# Patient Record
Sex: Female | Born: 1990 | Race: Black or African American | Hispanic: No | Marital: Married | Smoking: Former smoker
Health system: Southern US, Community
[De-identification: ages and names within clinical notes are randomized; demographics above are authoritative.]

## PROBLEM LIST (undated history)

## (undated) ENCOUNTER — Inpatient Hospital Stay (HOSPITAL_COMMUNITY): Payer: Self-pay

## (undated) DIAGNOSIS — D499 Neoplasm of unspecified behavior of unspecified site: Secondary | ICD-10-CM

## (undated) HISTORY — DX: Neoplasm of unspecified behavior of unspecified site: D49.9

---
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
## 2010-10-14 ENCOUNTER — Emergency Department (HOSPITAL_COMMUNITY)
Admission: EM | Admit: 2010-10-14 | Discharge: 2010-10-15 | Payer: Self-pay | Source: Home / Self Care | Admitting: Emergency Medicine

## 2011-01-10 LAB — DIFFERENTIAL
Basophils Absolute: 0 10*3/uL (ref 0.0–0.1)
Basophils Relative: 0 % (ref 0–1)
Eosinophils Absolute: 0.1 10*3/uL (ref 0.0–0.7)
Eosinophils Relative: 1 % (ref 0–5)
Lymphocytes Relative: 20 % (ref 12–46)
Lymphs Abs: 2.9 10*3/uL (ref 0.7–4.0)
Monocytes Absolute: 1.1 10*3/uL — ABNORMAL HIGH (ref 0.1–1.0)
Monocytes Relative: 7 % (ref 3–12)
Neutro Abs: 10.7 10*3/uL — ABNORMAL HIGH (ref 1.7–7.7)
Neutrophils Relative %: 72 % (ref 43–77)

## 2011-01-10 LAB — CBC
HCT: 35.1 % — ABNORMAL LOW (ref 36.0–46.0)
Hemoglobin: 10.8 g/dL — ABNORMAL LOW (ref 12.0–15.0)
MCH: 22.9 pg — ABNORMAL LOW (ref 26.0–34.0)
MCHC: 30.8 g/dL (ref 30.0–36.0)
MCV: 74.4 fL — ABNORMAL LOW (ref 78.0–100.0)
Platelets: 446 10*3/uL — ABNORMAL HIGH (ref 150–400)
RBC: 4.72 MIL/uL (ref 3.87–5.11)
RDW: 16.4 % — ABNORMAL HIGH (ref 11.5–15.5)
WBC: 14.8 10*3/uL — ABNORMAL HIGH (ref 4.0–10.5)

## 2011-01-10 LAB — URINE CULTURE
Colony Count: 30000
Culture  Setup Time: 201112160845

## 2011-01-10 LAB — BASIC METABOLIC PANEL
BUN: 5 mg/dL — ABNORMAL LOW (ref 6–23)
CO2: 27 mEq/L (ref 19–32)
Calcium: 8.9 mg/dL (ref 8.4–10.5)
Chloride: 106 mEq/L (ref 96–112)
Creatinine, Ser: 0.83 mg/dL (ref 0.4–1.2)
GFR calc Af Amer: >60 mL/min (ref >60)
GFR calc non Af Amer: >60 mL/min (ref >60)
Glucose, Bld: 87 mg/dL (ref 70–99)
Potassium: 3.4 mEq/L — ABNORMAL LOW (ref 3.5–5.1)
Sodium: 138 mEq/L (ref 135–145)

## 2011-01-10 LAB — URINALYSIS, ROUTINE W REFLEX MICROSCOPIC
Bilirubin Urine: NEGATIVE
Glucose, UA: NEGATIVE mg/dL
Ketones, ur: NEGATIVE mg/dL
Nitrite: NEGATIVE
Protein, ur: NEGATIVE mg/dL
Specific Gravity, Urine: 1.028 (ref 1.005–1.030)
Urobilinogen, UA: 2 mg/dL — ABNORMAL HIGH (ref 0.0–1.0)
pH: 7 (ref 5.0–8.0)

## 2011-01-10 LAB — WET PREP, GENITAL
Trich, Wet Prep: NONE SEEN
Yeast Wet Prep HPF POC: NONE SEEN

## 2011-01-10 LAB — URINE MICROSCOPIC-ADD ON

## 2011-01-10 LAB — PREGNANCY, URINE: Preg Test, Ur: NEGATIVE

## 2011-01-10 LAB — GC/CHLAMYDIA PROBE AMP, GENITAL
Chlamydia, DNA Probe: NEGATIVE
GC Probe Amp, Genital: POSITIVE — AB

## 2011-06-29 ENCOUNTER — Emergency Department (HOSPITAL_COMMUNITY)
Admission: EM | Admit: 2011-06-29 | Discharge: 2011-06-30 | Disposition: A | Discharge status: Home / Self Care | Payer: Medicaid Other | Attending: Emergency Medicine | Admitting: Emergency Medicine

## 2011-06-29 DIAGNOSIS — R42 Dizziness and giddiness: Secondary | ICD-10-CM | POA: Insufficient documentation

## 2011-06-29 DIAGNOSIS — O9989 Other specified diseases and conditions complicating pregnancy, childbirth and the puerperium: Secondary | ICD-10-CM | POA: Insufficient documentation

## 2011-06-29 DIAGNOSIS — R109 Unspecified abdominal pain: Secondary | ICD-10-CM | POA: Insufficient documentation

## 2011-06-29 LAB — POCT PREGNANCY, URINE: Preg Test, Ur: POSITIVE

## 2011-06-30 ENCOUNTER — Emergency Department (HOSPITAL_COMMUNITY): Payer: Medicaid Other

## 2011-06-30 LAB — DIFFERENTIAL
Basophils Absolute: 0.1 10*3/uL (ref 0.0–0.1)
Basophils Relative: 0 % (ref 0–1)
Eosinophils Absolute: 0.4 10*3/uL (ref 0.0–0.7)
Eosinophils Relative: 3 % (ref 0–5)
Lymphocytes Relative: 28 % (ref 12–46)
Lymphs Abs: 4.6 10*3/uL — ABNORMAL HIGH (ref 0.7–4.0)
Monocytes Absolute: 0.9 10*3/uL (ref 0.1–1.0)
Monocytes Relative: 6 % (ref 3–12)
Neutro Abs: 10.4 10*3/uL — ABNORMAL HIGH (ref 1.7–7.7)
Neutrophils Relative %: 64 % (ref 43–77)

## 2011-06-30 LAB — URINALYSIS, ROUTINE W REFLEX MICROSCOPIC
Bilirubin Urine: NEGATIVE
Glucose, UA: NEGATIVE mg/dL
Hgb urine dipstick: NEGATIVE
Ketones, ur: NEGATIVE mg/dL
Nitrite: NEGATIVE
Protein, ur: NEGATIVE mg/dL
Specific Gravity, Urine: 1.025 (ref 1.005–1.030)
Urobilinogen, UA: 1 mg/dL (ref 0.0–1.0)
pH: 6.5 (ref 5.0–8.0)

## 2011-06-30 LAB — CBC
HCT: 32.2 % — ABNORMAL LOW (ref 36.0–46.0)
Hemoglobin: 10 g/dL — ABNORMAL LOW (ref 12.0–15.0)
MCH: 22.6 pg — ABNORMAL LOW (ref 26.0–34.0)
MCHC: 31.1 g/dL (ref 30.0–36.0)
MCV: 72.9 fL — ABNORMAL LOW (ref 78.0–100.0)
Platelets: 383 10*3/uL (ref 150–400)
RBC: 4.42 MIL/uL (ref 3.87–5.11)
RDW: 16.5 % — ABNORMAL HIGH (ref 11.5–15.5)
WBC: 16.3 10*3/uL — ABNORMAL HIGH (ref 4.0–10.5)

## 2011-06-30 LAB — URINE MICROSCOPIC-ADD ON

## 2011-06-30 LAB — HCG, QUANTITATIVE, PREGNANCY: hCG, Beta Chain, Quant, S: 17108 m[IU]/mL — ABNORMAL HIGH (ref <5)

## 2011-08-20 ENCOUNTER — Emergency Department (HOSPITAL_COMMUNITY)
Admission: EM | Admit: 2011-08-20 | Discharge: 2011-08-20 | Disposition: A | Discharge status: Home / Self Care | Payer: Medicaid Other | Attending: Emergency Medicine | Admitting: Emergency Medicine

## 2011-08-20 ENCOUNTER — Emergency Department (HOSPITAL_COMMUNITY): Payer: Medicaid Other

## 2011-08-20 DIAGNOSIS — R109 Unspecified abdominal pain: Secondary | ICD-10-CM | POA: Insufficient documentation

## 2011-08-20 DIAGNOSIS — N949 Unspecified condition associated with female genital organs and menstrual cycle: Secondary | ICD-10-CM | POA: Insufficient documentation

## 2011-08-20 DIAGNOSIS — O99891 Other specified diseases and conditions complicating pregnancy: Secondary | ICD-10-CM | POA: Insufficient documentation

## 2011-08-20 DIAGNOSIS — R10819 Abdominal tenderness, unspecified site: Secondary | ICD-10-CM | POA: Insufficient documentation

## 2011-08-20 LAB — URINE MICROSCOPIC-ADD ON

## 2011-08-20 LAB — URINALYSIS, ROUTINE W REFLEX MICROSCOPIC
Bilirubin Urine: NEGATIVE
Glucose, UA: NEGATIVE mg/dL
Hgb urine dipstick: NEGATIVE
Ketones, ur: 15 mg/dL — AB
Nitrite: NEGATIVE
Protein, ur: NEGATIVE mg/dL
Specific Gravity, Urine: 1.018 (ref 1.005–1.030)
Urobilinogen, UA: 0.2 mg/dL (ref 0.0–1.0)
pH: 7 (ref 5.0–8.0)

## 2011-08-20 LAB — WET PREP, GENITAL
Clue Cells Wet Prep HPF POC: NONE SEEN
Trich, Wet Prep: NONE SEEN
Yeast Wet Prep HPF POC: NONE SEEN

## 2011-08-22 LAB — GC/CHLAMYDIA PROBE AMP, GENITAL
Chlamydia, DNA Probe: NEGATIVE
GC Probe Amp, Genital: NEGATIVE

## 2011-08-22 LAB — URINE CULTURE

## 2011-09-16 LAB — OB RESULTS CONSOLE ANTIBODY SCREEN: Antibody Screen: NEGATIVE

## 2011-09-16 LAB — OB RESULTS CONSOLE HEPATITIS B SURFACE ANTIGEN: Hepatitis B Surface Ag: NEGATIVE

## 2011-09-16 LAB — OB RESULTS CONSOLE ABO/RH: RH Type: POSITIVE

## 2011-09-16 LAB — OB RESULTS CONSOLE GC/CHLAMYDIA: Gonorrhea: NEGATIVE

## 2011-09-20 ENCOUNTER — Inpatient Hospital Stay (HOSPITAL_COMMUNITY)
Admission: AD | Admit: 2011-09-20 | Discharge: 2011-09-20 | Disposition: A | Discharge status: Home / Self Care | Payer: Medicaid Other | Source: Ambulatory Visit | Attending: Obstetrics | Admitting: Obstetrics

## 2011-09-20 ENCOUNTER — Encounter (HOSPITAL_COMMUNITY): Payer: Self-pay

## 2011-09-20 DIAGNOSIS — T148XXA Other injury of unspecified body region, initial encounter: Secondary | ICD-10-CM

## 2011-09-20 DIAGNOSIS — M542 Cervicalgia: Secondary | ICD-10-CM | POA: Insufficient documentation

## 2011-09-20 DIAGNOSIS — M25519 Pain in unspecified shoulder: Secondary | ICD-10-CM

## 2011-09-20 DIAGNOSIS — O99891 Other specified diseases and conditions complicating pregnancy: Secondary | ICD-10-CM | POA: Insufficient documentation

## 2011-09-20 DIAGNOSIS — M62838 Other muscle spasm: Secondary | ICD-10-CM | POA: Insufficient documentation

## 2011-09-20 MED ORDER — CYCLOBENZAPRINE HCL 10 MG PO TABS
10.0000 mg | ORAL_TABLET | Freq: Once | ORAL | Status: AC
  Administered 2011-09-20: 10 mg via ORAL
  Filled 2011-09-20: qty 1

## 2011-09-20 MED ORDER — CYCLOBENZAPRINE HCL 10 MG PO TABS: 10.0000 mg | ORAL_TABLET | Freq: Three times a day (TID) | ORAL | Status: DC | PRN

## 2011-09-20 NOTE — Progress Notes (Signed)
Patient states she has been having pain on the left side of her neck radiating into left shoulder for about 3 days, Hurtst more with movement.

## 2011-09-20 NOTE — ED Provider Notes (Signed)
Chief Complaint:  Neck Pain   Paula Sandoval is  20 y.o. G1P0.  No LMP recorded. Patient is pregnant..  Her pregnancy status is positive.[redacted]w[redacted]d   She presents complaining of Neck Pain . Onset is described as intermittent and has been present for  3 days. Pain in left neck and shoulder. Denies shoulder injury. No pregnancy complaints.   Obstetrical/Gynecological History: OB History    Grav Para Term Preterm Abortions TAB SAB Ect Mult Living   1               Past Medical History: Past Medical History  Diagnosis Date  . Asthma     Past Surgical History: Past Surgical History  Procedure Date  . Excision of tumor right leg 2009    Family History: Family History  Problem Relation Age of Onset  . Anesthesia problems Neg Hx   . Hypotension Neg Hx   . Malignant hyperthermia Neg Hx   . Pseudochol deficiency Neg Hx     Social History: History  Substance Use Topics  . Smoking status: Never Smoker   . Smokeless tobacco: Never Used  . Alcohol Use: No    Allergies: No Known Allergies  Prescriptions prior to admission  Medication Sig Dispense Refill  . prenatal vitamin w/FE, FA (PRENATAL 1 + 1) 27-1 MG TABS Take 1 tablet by mouth daily.          Review of Systems - Negative except what is noted in HPI  Physical Exam   Blood pressure 120/70, pulse 108, temperature 99.4 F (37.4 C), temperature source Oral, resp. rate 20, height 5' 9.5" (1.765 m), weight 134.718 kg (297 lb), SpO2 98.00%.  General: General appearance - alert, well appearing, and in no distress, oriented to person, place, and time and overweight Mental status - alert, oriented to person, place, and time, normal mood, behavior, speech, dress, motor activity, and thought processes, affect appropriate to mood Neck - supple, no significant adenopathy, full ROM Lymphatics - no palpable lymphadenopathy, no hepatosplenomegaly Abdomen - soft, nontender, nondistended, no masses or organomegaly obese Neurological -  alert, oriented, normal speech, no focal findings or movement disorder noted, screening mental status exam normal, neck supple without rigidity, cranial nerves II through XII intact, equal strength of upper extremities BL Musculoskeletal - abnormal exam of left shoulder, abnormal active range of motion of shoulder, + soft tissue tenderness in left shoulder, no evidence of dislocation Extremities - peripheral pulses normal, no pedal edema, no clubbing or cyanosis Focused Gynecological Exam: examination not indicated, FHR: 154 dopplered  ED Course: Muscle relaxer given  Assessment: Muscle strain/spasm of left shoulder  Plan: Discharge home  Rx flexeril Alternate heat and ice, may use non-aspirin sports cream prn  Deavin Forst E. 09/20/2011,4:22 PM

## 2012-01-26 LAB — OB RESULTS CONSOLE GBS: GBS: NEGATIVE

## 2012-02-14 ENCOUNTER — Encounter (HOSPITAL_COMMUNITY): Payer: Self-pay

## 2012-02-14 ENCOUNTER — Inpatient Hospital Stay (HOSPITAL_COMMUNITY)
Admission: AD | Admit: 2012-02-14 | Discharge: 2012-02-14 | Disposition: A | Discharge status: Home / Self Care | Payer: Medicaid Other | Source: Ambulatory Visit | Attending: Obstetrics | Admitting: Obstetrics

## 2012-02-14 DIAGNOSIS — O479 False labor, unspecified: Secondary | ICD-10-CM | POA: Insufficient documentation

## 2012-02-14 NOTE — MAU Note (Signed)
Per pt ctx's q3 minutes, began getting consistent at 0030, denies bleeding or lof.

## 2012-02-14 NOTE — Discharge Instructions (Signed)
Fetal Movement Counts Patient Name: __________________________________________________ Patient Due Date: ____________________ Kick counts is highly recommended in high risk pregnancies, but it is a good idea for every pregnant woman to do. Start counting fetal movements at 28 weeks of the pregnancy. Fetal movements increase after eating a full meal or eating or drinking something sweet (the blood sugar is higher). It is also important to drink plenty of fluids (well hydrated) before doing the count. Lie on your left side because it helps with the circulation or you can sit in a comfortable chair with your arms over your belly (abdomen) with no distractions around you. DOING THE COUNT  Try to do the count the same time of day each time you do it.   Mark the day and time, then see how long it takes for you to feel 10 movements (kicks, flutters, swishes, rolls). You should have at least 10 movements within 2 hours. You will most likely feel 10 movements in much less than 2 hours. If you do not, wait an hour and count again. After a couple of days you will see a pattern.   What you are looking for is a change in the pattern or not enough counts in 2 hours. Is it taking longer in time to reach 10 movements?  SEEK MEDICAL CARE IF:  You feel less than 10 counts in 2 hours. Tried twice.   No movement in one hour.   The pattern is changing or taking longer each day to reach 10 counts in 2 hours.   You feel the baby is not moving as it usually does.  Date: ____________ Movements: ____________ Start time: ____________ Finish time: ____________  Date: ____________ Movements: ____________ Start time: ____________ Finish time: ____________ Date: ____________ Movements: ____________ Start time: ____________ Finish time: ____________ Date: ____________ Movements: ____________ Start time: ____________ Finish time: ____________ Date: ____________ Movements: ____________ Start time: ____________ Finish time:  ____________ Date: ____________ Movements: ____________ Start time: ____________ Finish time: ____________ Date: ____________ Movements: ____________ Start time: ____________ Finish time: ____________ Date: ____________ Movements: ____________ Start time: ____________ Finish time: ____________  Date: ____________ Movements: ____________ Start time: ____________ Finish time: ____________ Date: ____________ Movements: ____________ Start time: ____________ Finish time: ____________ Date: ____________ Movements: ____________ Start time: ____________ Finish time: ____________ Date: ____________ Movements: ____________ Start time: ____________ Finish time: ____________ Date: ____________ Movements: ____________ Start time: ____________ Finish time: ____________ Date: ____________ Movements: ____________ Start time: ____________ Finish time: ____________ Date: ____________ Movements: ____________ Start time: ____________ Finish time: ____________  Date: ____________ Movements: ____________ Start time: ____________ Finish time: ____________ Date: ____________ Movements: ____________ Start time: ____________ Finish time: ____________ Date: ____________ Movements: ____________ Start time: ____________ Finish time: ____________ Date: ____________ Movements: ____________ Start time: ____________ Finish time: ____________ Date: ____________ Movements: ____________ Start time: ____________ Finish time: ____________ Date: ____________ Movements: ____________ Start time: ____________ Finish time: ____________ Date: ____________ Movements: ____________ Start time: ____________ Finish time: ____________  Date: ____________ Movements: ____________ Start time: ____________ Finish time: ____________ Date: ____________ Movements: ____________ Start time: ____________ Finish time: ____________ Date: ____________ Movements: ____________ Start time: ____________ Finish time: ____________ Date: ____________ Movements:  ____________ Start time: ____________ Finish time: ____________ Date: ____________ Movements: ____________ Start time: ____________ Finish time: ____________ Date: ____________ Movements: ____________ Start time: ____________ Finish time: ____________ Date: ____________ Movements: ____________ Start time: ____________ Finish time: ____________  Date: ____________ Movements: ____________ Start time: ____________ Finish time: ____________ Date: ____________ Movements: ____________ Start time: ____________ Finish time: ____________ Date: ____________ Movements: ____________ Start time:   ____________ Finish time: ____________ Date: ____________ Movements: ____________ Start time: ____________ Finish time: ____________ Date: ____________ Movements: ____________ Start time: ____________ Finish time: ____________ Date: ____________ Movements: ____________ Start time: ____________ Finish time: ____________ Date: ____________ Movements: ____________ Start time: ____________ Finish time: ____________  Date: ____________ Movements: ____________ Start time: ____________ Finish time: ____________ Date: ____________ Movements: ____________ Start time: ____________ Finish time: ____________ Date: ____________ Movements: ____________ Start time: ____________ Finish time: ____________ Date: ____________ Movements: ____________ Start time: ____________ Finish time: ____________ Date: ____________ Movements: ____________ Start time: ____________ Finish time: ____________ Date: ____________ Movements: ____________ Start time: ____________ Finish time: ____________ Date: ____________ Movements: ____________ Start time: ____________ Finish time: ____________  Date: ____________ Movements: ____________ Start time: ____________ Finish time: ____________ Date: ____________ Movements: ____________ Start time: ____________ Finish time: ____________ Date: ____________ Movements: ____________ Start time: ____________ Finish  time: ____________ Date: ____________ Movements: ____________ Start time: ____________ Finish time: ____________ Date: ____________ Movements: ____________ Start time: ____________ Finish time: ____________ Date: ____________ Movements: ____________ Start time: ____________ Finish time: ____________ Date: ____________ Movements: ____________ Start time: ____________ Finish time: ____________  Date: ____________ Movements: ____________ Start time: ____________ Finish time: ____________ Date: ____________ Movements: ____________ Start time: ____________ Finish time: ____________ Date: ____________ Movements: ____________ Start time: ____________ Finish time: ____________ Date: ____________ Movements: ____________ Start time: ____________ Finish time: ____________ Date: ____________ Movements: ____________ Start time: ____________ Finish time: ____________ Date: ____________ Movements: ____________ Start time: ____________ Finish time: ____________ Document Released: 11/16/2006 Document Revised: 10/06/2011 Document Reviewed: 05/19/2009 [employer] Patient Information 2012 [employer].Normal Labor and Delivery Your caregiver must first be sure you are in labor. Signs of labor include:  You may pass what is called "the mucus plug" before labor begins. This is a small amount of blood stained mucus.   Regular uterine contractions.   The time between contractions get closer together.   The discomfort and pain gradually gets more intense.   Pains are mostly located in the back.   Pains get worse when walking.   The cervix (the opening of the uterus becomes thinner (begins to efface) and opens up (dilates).  Once you are in labor and admitted into the hospital or care center, your caregiver will do the following:  A complete physical examination.   Check your vital signs (blood pressure, pulse, temperature and the fetal heart rate).   Do a vaginal examination (using a sterile glove and  lubricant) to determine:   The position (presentation) of the baby (head [vertex] or buttock first).   The level (station) of the baby's head in the birth canal.   The effacement and dilatation of the cervix.   You may have your pubic hair shaved and be given an enema depending on your caregiver and the circumstance.   An electronic monitor is usually placed on your abdomen. The monitor follows the length and intensity of the contractions, as well as the baby's heart rate.   Usually, your caregiver will insert an IV in your arm with a bottle of sugar water. This is done as a precaution so that medications can be given to you quickly during labor or delivery.  NORMAL LABOR AND DELIVERY IS DIVIDED UP INTO 3 STAGES: First Stage This is when regular contractions begin and the cervix begins to efface and dilate. This stage can last from 3 to 15 hours. The end of the first stage is when the cervix is 100% effaced and 10 centimeters dilated. Pain medications may be given by   Injection (morphine, demerol,   etc.)   Regional anesthesia (spinal, caudal or epidural, anesthetics given in different locations of the spine). Paracervical pain medication may be given, which is an injection of and anesthetic on each side of the cervix.  A pregnant woman may request to have "Natural Childbirth" which is not to have any medications or anesthesia during her labor and delivery. Second Stage This is when the baby comes down through the birth canal (vagina) and is born. This can take 1 to 4 hours. As the baby's head comes down through the birth canal, you may feel like you are going to have a bowel movement. You will get the urge to bear down and push until the baby is delivered. As the baby's head is being delivered, the caregiver will decide if an episiotomy (a cut in the perineum and vagina area) is needed to prevent tearing of the tissue in this area. The episiotomy is sewn up after the delivery of the baby and  placenta. Sometimes a mask with nitrous oxide is given for the mother to breath during the delivery of the baby to help if there is too much pain. The end of Stage 2 is when the baby is fully delivered. Then when the umbilical cord stops pulsating it is clamped and cut. Third Stage The third stage begins after the baby is completely delivered and ends after the placenta (afterbirth) is delivered. This usually takes 5 to 30 minutes. After the placenta is delivered, a medication is given either by intravenous or injection to help contract the uterus and prevent bleeding. The third stage is not painful and pain medication is usually not necessary. If an episiotomy was done, it is repaired at this time. After the delivery, the mother is watched and monitored closely for 1 to 2 hours to make sure there is no postpartum bleeding (hemorrhage). If there is a lot of bleeding, medication is given to contract the uterus and stop the bleeding. Document Released: 07/26/2008 Document Revised: 10/06/2011 Document Reviewed: 07/26/2008 [employer] Patient Information 2012 [employer].  

## 2012-02-14 NOTE — MAU Note (Signed)
Dr. Gaynell Face notified pt here for ctx's, denies bleeding or lof. EFM tracing reactive, cervix soft/1cm. Orders to d/c home with labor precautions.

## 2012-02-14 NOTE — MAU Note (Signed)
Patient states contractions every 3-4 minutes. Denies any bleeding or leaking and reports good fetal movement.

## 2012-02-20 ENCOUNTER — Encounter (HOSPITAL_COMMUNITY): Payer: Self-pay | Admitting: *Deleted

## 2012-02-20 ENCOUNTER — Telehealth (HOSPITAL_COMMUNITY): Payer: Self-pay | Admitting: *Deleted

## 2012-02-20 NOTE — Telephone Encounter (Signed)
Preadmission screen  

## 2012-02-21 ENCOUNTER — Inpatient Hospital Stay (HOSPITAL_COMMUNITY)
Admission: RE | Admit: 2012-02-21 | Discharge: 2012-02-25 | DRG: 766 | Disposition: A | Discharge status: Home / Self Care | Payer: Medicaid Other | Source: Ambulatory Visit | Attending: Obstetrics | Admitting: Obstetrics

## 2012-02-21 VITALS — BP 101/70 | HR 107 | Temp 97.9°F | Resp 20 | Ht 68.0 in | Wt 299.0 lb

## 2012-02-21 DIAGNOSIS — Z98891 History of uterine scar from previous surgery: Secondary | ICD-10-CM

## 2012-02-21 LAB — CBC
MCH: 22.8 pg — ABNORMAL LOW (ref 26.0–34.0)
MCV: 76 fL — ABNORMAL LOW (ref 78.0–100.0)
Platelets: 324 10*3/uL (ref 150–400)
RDW: 17.2 % — ABNORMAL HIGH (ref 11.5–15.5)

## 2012-02-21 MED ORDER — ZOLPIDEM TARTRATE 10 MG PO TABS: 10.0000 mg | ORAL_TABLET | Freq: Every evening | ORAL | Status: DC | PRN

## 2012-02-21 MED ORDER — OXYTOCIN 20 UNITS IN LACTATED RINGERS INFUSION - SIMPLE: 125.0000 mL/h | Freq: Once | INTRAVENOUS | Status: DC

## 2012-02-21 MED ORDER — OXYTOCIN BOLUS FROM INFUSION
500.0000 mL | Freq: Once | INTRAVENOUS | Status: DC
  Filled 2012-02-21: qty 500

## 2012-02-21 MED ORDER — LACTATED RINGERS IV SOLN
INTRAVENOUS | Status: DC
  Administered 2012-02-21 – 2012-02-22 (×2): via INTRAVENOUS

## 2012-02-21 MED ORDER — TERBUTALINE SULFATE 1 MG/ML IJ SOLN
0.2500 mg | Freq: Once | INTRAMUSCULAR | Status: AC | PRN
  Administered 2012-02-22: 0.25 mg via SUBCUTANEOUS
  Filled 2012-02-21: qty 1

## 2012-02-21 MED ORDER — BUTORPHANOL TARTRATE 2 MG/ML IJ SOLN: 1.0000 mg | INTRAMUSCULAR | Status: DC | PRN

## 2012-02-21 MED ORDER — OXYTOCIN 20 UNITS IN LACTATED RINGERS INFUSION - SIMPLE: 1.0000 m[IU]/min | INTRAVENOUS | Status: DC

## 2012-02-21 MED ORDER — LIDOCAINE HCL (PF) 1 % IJ SOLN: 30.0000 mL | INTRAMUSCULAR | Status: DC | PRN

## 2012-02-21 MED ORDER — FLEET ENEMA 7-19 GM/118ML RE ENEM: 1.0000 | ENEMA | RECTAL | Status: DC | PRN

## 2012-02-21 MED ORDER — CITRIC ACID-SODIUM CITRATE 334-500 MG/5ML PO SOLN
30.0000 mL | ORAL | Status: DC | PRN
  Administered 2012-02-22: 30 mL via ORAL
  Filled 2012-02-21: qty 15

## 2012-02-21 MED ORDER — MISOPROSTOL 25 MCG QUARTER TABLET
25.0000 ug | ORAL_TABLET | ORAL | Status: DC | PRN
  Administered 2012-02-21: 25 ug via VAGINAL
  Filled 2012-02-21 (×2): qty 0.25

## 2012-02-21 MED ORDER — IBUPROFEN 600 MG PO TABS: 600.0000 mg | ORAL_TABLET | Freq: Four times a day (QID) | ORAL | Status: DC | PRN

## 2012-02-21 MED ORDER — ACETAMINOPHEN 325 MG PO TABS: 650.0000 mg | ORAL_TABLET | ORAL | Status: DC | PRN

## 2012-02-21 MED ORDER — ONDANSETRON HCL 4 MG/2ML IJ SOLN: 4.0000 mg | Freq: Four times a day (QID) | INTRAMUSCULAR | Status: DC | PRN

## 2012-02-21 MED ORDER — OXYCODONE-ACETAMINOPHEN 5-325 MG PO TABS: 1.0000 | ORAL_TABLET | ORAL | Status: DC | PRN

## 2012-02-21 MED ORDER — LACTATED RINGERS IV SOLN: 500.0000 mL | INTRAVENOUS | Status: DC | PRN

## 2012-02-22 ENCOUNTER — Inpatient Hospital Stay (HOSPITAL_COMMUNITY): Admission: RE | Admit: 2012-02-22 | Payer: Medicaid Other | Source: Ambulatory Visit

## 2012-02-22 ENCOUNTER — Inpatient Hospital Stay (HOSPITAL_COMMUNITY): Payer: Medicaid Other | Admitting: Anesthesiology

## 2012-02-22 ENCOUNTER — Encounter (HOSPITAL_COMMUNITY)
Admission: RE | Disposition: A | Discharge status: Home / Self Care | Payer: Self-pay | Source: Ambulatory Visit | Attending: Obstetrics

## 2012-02-22 ENCOUNTER — Encounter (HOSPITAL_COMMUNITY): Payer: Self-pay | Admitting: Anesthesiology

## 2012-02-22 ENCOUNTER — Encounter (HOSPITAL_COMMUNITY): Payer: Self-pay

## 2012-02-22 SURGERY — Surgical Case
Anesthesia: Spinal

## 2012-02-22 MED ORDER — ONDANSETRON HCL 4 MG PO TABS: 4.0000 mg | ORAL_TABLET | ORAL | Status: DC | PRN

## 2012-02-22 MED ORDER — FENTANYL CITRATE 0.05 MG/ML IJ SOLN
INTRAMUSCULAR | Status: DC | PRN
  Administered 2012-02-22: 15 ug via INTRATHECAL

## 2012-02-22 MED ORDER — ONDANSETRON HCL 4 MG/2ML IJ SOLN: 4.0000 mg | Freq: Three times a day (TID) | INTRAMUSCULAR | Status: DC | PRN

## 2012-02-22 MED ORDER — KETOROLAC TROMETHAMINE 30 MG/ML IJ SOLN
30.0000 mg | Freq: Four times a day (QID) | INTRAMUSCULAR | Status: AC | PRN
  Filled 2012-02-22: qty 1

## 2012-02-22 MED ORDER — FENTANYL CITRATE 0.05 MG/ML IJ SOLN
INTRAMUSCULAR | Status: AC
  Filled 2012-02-22: qty 2

## 2012-02-22 MED ORDER — SENNOSIDES-DOCUSATE SODIUM 8.6-50 MG PO TABS
2.0000 | ORAL_TABLET | Freq: Every day | ORAL | Status: DC
  Administered 2012-02-22 – 2012-02-24 (×3): 2 via ORAL

## 2012-02-22 MED ORDER — ONDANSETRON HCL 4 MG/2ML IJ SOLN
INTRAMUSCULAR | Status: DC | PRN
  Administered 2012-02-22: 4 mg via INTRAVENOUS

## 2012-02-22 MED ORDER — PHENYLEPHRINE 40 MCG/ML (10ML) SYRINGE FOR IV PUSH (FOR BLOOD PRESSURE SUPPORT)
PREFILLED_SYRINGE | INTRAVENOUS | Status: AC
  Filled 2012-02-22: qty 15

## 2012-02-22 MED ORDER — ONDANSETRON HCL 4 MG/2ML IJ SOLN: 4.0000 mg | INTRAMUSCULAR | Status: DC | PRN

## 2012-02-22 MED ORDER — OXYCODONE-ACETAMINOPHEN 5-325 MG PO TABS
1.0000 | ORAL_TABLET | ORAL | Status: DC | PRN
  Administered 2012-02-22: 1 via ORAL
  Administered 2012-02-23 – 2012-02-24 (×6): 2 via ORAL
  Administered 2012-02-25 (×2): 1 via ORAL
  Filled 2012-02-22: qty 1
  Filled 2012-02-22 (×5): qty 2
  Filled 2012-02-22: qty 1
  Filled 2012-02-22 (×2): qty 2

## 2012-02-22 MED ORDER — DIBUCAINE 1 % RE OINT: 1.0000 "application " | TOPICAL_OINTMENT | RECTAL | Status: DC | PRN

## 2012-02-22 MED ORDER — MEPERIDINE HCL 25 MG/ML IJ SOLN
INTRAMUSCULAR | Status: AC
  Filled 2012-02-22: qty 1

## 2012-02-22 MED ORDER — ONDANSETRON HCL 4 MG/2ML IJ SOLN
INTRAMUSCULAR | Status: AC
  Filled 2012-02-22: qty 2

## 2012-02-22 MED ORDER — DIPHENHYDRAMINE HCL 50 MG/ML IJ SOLN: 12.5000 mg | INTRAMUSCULAR | Status: DC | PRN

## 2012-02-22 MED ORDER — LACTATED RINGERS IV SOLN
INTRAVENOUS | Status: DC | PRN
  Administered 2012-02-22 (×3): via INTRAVENOUS

## 2012-02-22 MED ORDER — METOCLOPRAMIDE HCL 5 MG/ML IJ SOLN: 10.0000 mg | Freq: Three times a day (TID) | INTRAMUSCULAR | Status: DC | PRN

## 2012-02-22 MED ORDER — MEPERIDINE HCL 25 MG/ML IJ SOLN
INTRAMUSCULAR | Status: DC | PRN
  Administered 2012-02-22: 25 mg via INTRAVENOUS

## 2012-02-22 MED ORDER — MEPERIDINE HCL 25 MG/ML IJ SOLN: 6.2500 mg | INTRAMUSCULAR | Status: DC | PRN

## 2012-02-22 MED ORDER — WITCH HAZEL-GLYCERIN EX PADS: 1.0000 "application " | MEDICATED_PAD | CUTANEOUS | Status: DC | PRN

## 2012-02-22 MED ORDER — SCOPOLAMINE 1 MG/3DAYS TD PT72
1.0000 | MEDICATED_PATCH | Freq: Once | TRANSDERMAL | Status: AC
  Administered 2012-02-22: 1.5 mg via TRANSDERMAL

## 2012-02-22 MED ORDER — METOCLOPRAMIDE HCL 5 MG/ML IJ SOLN
INTRAMUSCULAR | Status: DC | PRN
  Administered 2012-02-22: 10 mg via INTRAVENOUS

## 2012-02-22 MED ORDER — KETOROLAC TROMETHAMINE 60 MG/2ML IM SOLN
60.0000 mg | Freq: Once | INTRAMUSCULAR | Status: AC | PRN
  Administered 2012-02-22: 60 mg via INTRAMUSCULAR

## 2012-02-22 MED ORDER — SIMETHICONE 80 MG PO CHEW
80.0000 mg | CHEWABLE_TABLET | Freq: Three times a day (TID) | ORAL | Status: DC
  Administered 2012-02-22 – 2012-02-25 (×10): 80 mg via ORAL

## 2012-02-22 MED ORDER — LACTATED RINGERS IV SOLN
INTRAVENOUS | Status: DC
  Administered 2012-02-23: 01:00:00 via INTRAVENOUS

## 2012-02-22 MED ORDER — MENTHOL 3 MG MT LOZG: 1.0000 | LOZENGE | OROMUCOSAL | Status: DC | PRN

## 2012-02-22 MED ORDER — OXYTOCIN 20 UNITS IN LACTATED RINGERS INFUSION - SIMPLE
INTRAVENOUS | Status: DC | PRN
  Administered 2012-02-22 (×2): 20 [IU] via INTRAVENOUS

## 2012-02-22 MED ORDER — LANOLIN HYDROUS EX OINT: 1.0000 "application " | TOPICAL_OINTMENT | CUTANEOUS | Status: DC | PRN

## 2012-02-22 MED ORDER — ZOLPIDEM TARTRATE 5 MG PO TABS: 5.0000 mg | ORAL_TABLET | Freq: Every evening | ORAL | Status: DC | PRN

## 2012-02-22 MED ORDER — SODIUM CHLORIDE 0.9 % IV SOLN
1.0000 ug/kg/h | INTRAVENOUS | Status: DC | PRN
  Filled 2012-02-22: qty 2.5

## 2012-02-22 MED ORDER — MORPHINE SULFATE 0.5 MG/ML IJ SOLN
INTRAMUSCULAR | Status: AC
  Filled 2012-02-22: qty 10

## 2012-02-22 MED ORDER — MORPHINE SULFATE (PF) 0.5 MG/ML IJ SOLN
INTRAMUSCULAR | Status: DC | PRN
  Administered 2012-02-22: .1 mg via INTRATHECAL

## 2012-02-22 MED ORDER — CEFAZOLIN SODIUM 1-5 GM-% IV SOLN
1.0000 g | Freq: Three times a day (TID) | INTRAVENOUS | Status: DC
  Administered 2012-02-22: 1 g via INTRAVENOUS
  Filled 2012-02-22: qty 50

## 2012-02-22 MED ORDER — DIPHENHYDRAMINE HCL 25 MG PO CAPS: 25.0000 mg | ORAL_CAPSULE | Freq: Four times a day (QID) | ORAL | Status: DC | PRN

## 2012-02-22 MED ORDER — TETANUS-DIPHTH-ACELL PERTUSSIS 5-2.5-18.5 LF-MCG/0.5 IM SUSP: 0.5000 mL | Freq: Once | INTRAMUSCULAR | Status: DC

## 2012-02-22 MED ORDER — NALOXONE HCL 0.4 MG/ML IJ SOLN: 0.4000 mg | INTRAMUSCULAR | Status: DC | PRN

## 2012-02-22 MED ORDER — DIPHENHYDRAMINE HCL 50 MG/ML IJ SOLN: 25.0000 mg | INTRAMUSCULAR | Status: DC | PRN

## 2012-02-22 MED ORDER — CEFAZOLIN SODIUM 1-5 GM-% IV SOLN
INTRAVENOUS | Status: AC
  Filled 2012-02-22: qty 50

## 2012-02-22 MED ORDER — NALBUPHINE HCL 10 MG/ML IJ SOLN
5.0000 mg | INTRAMUSCULAR | Status: DC | PRN
  Filled 2012-02-22: qty 1

## 2012-02-22 MED ORDER — OXYTOCIN 10 UNIT/ML IJ SOLN
INTRAMUSCULAR | Status: AC
  Filled 2012-02-22: qty 4

## 2012-02-22 MED ORDER — PHENYLEPHRINE 40 MCG/ML (10ML) SYRINGE FOR IV PUSH (FOR BLOOD PRESSURE SUPPORT)
PREFILLED_SYRINGE | INTRAVENOUS | Status: AC
  Filled 2012-02-22: qty 5

## 2012-02-22 MED ORDER — SCOPOLAMINE 1 MG/3DAYS TD PT72
MEDICATED_PATCH | TRANSDERMAL | Status: AC
  Filled 2012-02-22: qty 1

## 2012-02-22 MED ORDER — IBUPROFEN 600 MG PO TABS
600.0000 mg | ORAL_TABLET | Freq: Four times a day (QID) | ORAL | Status: DC | PRN
  Filled 2012-02-22 (×4): qty 1

## 2012-02-22 MED ORDER — FENTANYL CITRATE 0.05 MG/ML IJ SOLN: 25.0000 ug | INTRAMUSCULAR | Status: DC | PRN

## 2012-02-22 MED ORDER — SODIUM CHLORIDE 0.9 % IJ SOLN: 3.0000 mL | INTRAMUSCULAR | Status: DC | PRN

## 2012-02-22 MED ORDER — METOCLOPRAMIDE HCL 5 MG/ML IJ SOLN
INTRAMUSCULAR | Status: AC
  Filled 2012-02-22: qty 2

## 2012-02-22 MED ORDER — CEFAZOLIN SODIUM 1-5 GM-% IV SOLN
1.0000 g | Freq: Three times a day (TID) | INTRAVENOUS | Status: DC
  Administered 2012-02-22 – 2012-02-23 (×3): 1 g via INTRAVENOUS
  Filled 2012-02-22 (×5): qty 50

## 2012-02-22 MED ORDER — OXYTOCIN 20 UNITS IN LACTATED RINGERS INFUSION - SIMPLE: 125.0000 mL/h | INTRAVENOUS | Status: AC

## 2012-02-22 MED ORDER — KETOROLAC TROMETHAMINE 30 MG/ML IJ SOLN
30.0000 mg | Freq: Four times a day (QID) | INTRAMUSCULAR | Status: AC | PRN
  Administered 2012-02-22: 30 mg via INTRAVENOUS

## 2012-02-22 MED ORDER — PRENATAL MULTIVITAMIN CH
1.0000 | ORAL_TABLET | Freq: Every day | ORAL | Status: DC
  Administered 2012-02-22 – 2012-02-25 (×4): 1 via ORAL
  Filled 2012-02-22 (×4): qty 1

## 2012-02-22 MED ORDER — PHENYLEPHRINE 40 MCG/ML (10ML) SYRINGE FOR IV PUSH (FOR BLOOD PRESSURE SUPPORT)
PREFILLED_SYRINGE | INTRAVENOUS | Status: AC
  Filled 2012-02-22: qty 10

## 2012-02-22 MED ORDER — PHENYLEPHRINE HCL 10 MG/ML IJ SOLN
INTRAMUSCULAR | Status: DC | PRN
  Administered 2012-02-22: 40 ug via INTRAVENOUS
  Administered 2012-02-22 (×2): 80 ug via INTRAVENOUS
  Administered 2012-02-22: 40 ug via INTRAVENOUS
  Administered 2012-02-22 (×2): 80 ug via INTRAVENOUS
  Administered 2012-02-22: 40 ug via INTRAVENOUS
  Administered 2012-02-22: 80 ug via INTRAVENOUS
  Administered 2012-02-22 (×2): 40 ug via INTRAVENOUS
  Administered 2012-02-22 (×4): 80 ug via INTRAVENOUS
  Administered 2012-02-22: 40 ug via INTRAVENOUS
  Administered 2012-02-22: 80 ug via INTRAVENOUS

## 2012-02-22 MED ORDER — KETOROLAC TROMETHAMINE 60 MG/2ML IM SOLN
INTRAMUSCULAR | Status: AC
  Filled 2012-02-22: qty 2

## 2012-02-22 MED ORDER — DIPHENHYDRAMINE HCL 25 MG PO CAPS: 25.0000 mg | ORAL_CAPSULE | ORAL | Status: DC | PRN

## 2012-02-22 MED ORDER — IBUPROFEN 600 MG PO TABS
600.0000 mg | ORAL_TABLET | Freq: Four times a day (QID) | ORAL | Status: DC
  Administered 2012-02-23 – 2012-02-25 (×11): 600 mg via ORAL
  Filled 2012-02-22 (×6): qty 1

## 2012-02-22 MED ORDER — BUPIVACAINE IN DEXTROSE 0.75-8.25 % IT SOLN
INTRATHECAL | Status: DC | PRN
  Administered 2012-02-22: 11.75 mg via INTRATHECAL

## 2012-02-22 MED ORDER — SIMETHICONE 80 MG PO CHEW: 80.0000 mg | CHEWABLE_TABLET | ORAL | Status: DC | PRN

## 2012-02-22 SURGICAL SUPPLY — 26 items
CLOTH BEACON ORANGE TIMEOUT ST (SAFETY) ×2 IMPLANT
DERMABOND ADVANCED (GAUZE/BANDAGES/DRESSINGS) ×1
DERMABOND ADVANCED .7 DNX12 (GAUZE/BANDAGES/DRESSINGS) ×1 IMPLANT
ELECT REM PT RETURN 9FT ADLT (ELECTROSURGICAL) ×2
ELECTRODE REM PT RTRN 9FT ADLT (ELECTROSURGICAL) ×1 IMPLANT
EXTRACTOR VACUUM M CUP 4 TUBE (SUCTIONS) IMPLANT
GLOVE BIO SURGEON STRL SZ8.5 (GLOVE) ×4 IMPLANT
GOWN PREVENTION PLUS LG XLONG (DISPOSABLE) ×4 IMPLANT
GOWN PREVENTION PLUS XXLARGE (GOWN DISPOSABLE) ×2 IMPLANT
KIT ABG SYR 3ML LUER SLIP (SYRINGE) IMPLANT
NEEDLE HYPO 25X5/8 SAFETYGLIDE (NEEDLE) ×2 IMPLANT
NS IRRIG 1000ML POUR BTL (IV SOLUTION) ×2 IMPLANT
PACK C SECTION WH (CUSTOM PROCEDURE TRAY) ×2 IMPLANT
SLEEVE SCD COMPRESS KNEE MED (MISCELLANEOUS) IMPLANT
SUT CHROMIC 0 CT 802H (SUTURE) ×2 IMPLANT
SUT CHROMIC 1 CTX 36 (SUTURE) ×4 IMPLANT
SUT CHROMIC 2 0 SH (SUTURE) ×2 IMPLANT
SUT GUT PLAIN 0 CT-3 TAN 27 (SUTURE) IMPLANT
SUT MON AB 4-0 PS1 27 (SUTURE) ×2 IMPLANT
SUT VIC AB 0 CT1 18XCR BRD8 (SUTURE) IMPLANT
SUT VIC AB 0 CT1 8-18 (SUTURE)
SUT VIC AB 0 CTX 36 (SUTURE) ×2
SUT VIC AB 0 CTX36XBRD ANBCTRL (SUTURE) ×2 IMPLANT
TOWEL OR 17X24 6PK STRL BLUE (TOWEL DISPOSABLE) ×4 IMPLANT
TRAY FOLEY CATH 14FR (SET/KITS/TRAYS/PACK) ×2 IMPLANT
WATER STERILE IRR 1000ML POUR (IV SOLUTION) ×2 IMPLANT

## 2012-02-22 NOTE — Addendum Note (Signed)
Addendum  created 02/22/12 0543 by Nira Visscher, MD   Modules edited:Anesthesia Medication Administration, Charting, Inpatient Notes    

## 2012-02-22 NOTE — Progress Notes (Signed)
Mother sitting in upright position, unsure if maternal hr was tracing or fetal hr

## 2012-02-22 NOTE — Anesthesia Postprocedure Evaluation (Signed)
Anesthesia Post Note  Patient: Paula Sandoval  Procedure(s) Performed: Procedure(s) (LRB): CESAREAN SECTION (N/A)  Anesthesia type: Spinal  Patient location: PACU  Post pain: Pain level controlled  Post assessment: Post-op Vital signs reviewed  Last Vitals:  Filed Vitals:   02/22/12 0530  BP: 115/57  Pulse: 93  Temp:   Resp: 14    Post vital signs: Reviewed  Level of consciousness: awake  Complications: No apparent anesthesia complications

## 2012-02-22 NOTE — Op Note (Signed)
preop diagnosis nonreassuring fetal heart rate tracing Postop diagnosis same Surgeon Dr. Francoise Ceo First Asst. Dr. Coral Ceo Anesthesia spinal Procedure patient placed on the operating table in the supine position after the spinal administered abdomen prepped and draped bladder and to the Foley catheter a transverse suprapubic incision made carried down to the rectus fascia fascia cleaned and incised the length of the incision recti muscles retracted laterally peritoneum incised longitudinally transverse incision made in the visceroperitoneum above the bladder bladder mobilized inferiorly transverse low uterine incision made patient delivered with a vacuum from the LOP position of a female Apgar 8 and 9 oligohydramnios was noted and the cord was wrapped around both legs the placenta was posterior more manually and sent to pathology uterine cavity clean with dry laps the uterine incision closed in in 2 layers with continuous within normal on chromic hemostasis was satisfactory bladder flap reattached to a chromic uterus was well contracted tubes and ovaries normal abdomen closed in layers peritoneum continuous with 2-0 chromic fascia continuous suture of 0 Dexon and the skin shows a subcuticular stitch of 4-0 Monocryl blood loss was 700 cc patient tolerated procedure well taken trocar him in good condition

## 2012-02-22 NOTE — Progress Notes (Signed)
Patient ID: Paula Sandoval, female   DOB: [birth year], 21 y.o.   MRN: [id number] The amnioinfusion did not work fluid would not go in and vertex -3 station the fetal heart which on admission was in the 150s is no in the 170s to 180s he said was decided should be delivered by C-section because of nonreassuring fetal heart rate tracing in a patient who will cannot stimulate because when she gets contractions she gets prolonged decelerations

## 2012-02-22 NOTE — H&P (Signed)
This is Dr. Francoise Ceo dictating the history and physical on blank blank she's a 21 year old gravida 1 at 40 weeks and 1 day desired induction negative GBS cervix was 1 cm 60% on admission and Cytotec was placed and after the first set to take it was noted she started having a him recent prolonged bearable but decelerations on examination the cervix was now 1 cm to 60% the vertex floating amniotomy was performed performed and amnioinfusion was attempted but no flow Past medical history negative Past surgical history negative Social history negative System review negative Physical an obese this obese female in no distress HEENT negative Heart regular rhythm no murmurs no gallops Breasts negative Lungs clear to P&A Heart regular rhythm no murmurs or gallops Abdomen term Extremities negative

## 2012-02-22 NOTE — Anesthesia Procedure Notes (Signed)
Spinal  Patient location during procedure: OR Start time: 02/22/2012 2:15 AM Staffing Performed by: anesthesiologist  Preanesthetic Checklist Completed: patient identified, site marked, surgical consent, pre-op evaluation, timeout performed, IV checked, risks and benefits discussed and monitors and equipment checked Spinal Block Patient position: sitting Prep: site prepped and draped and DuraPrep Patient monitoring: heart rate, cardiac monitor, continuous pulse ox and blood pressure Approach: midline Location: L3-4 Injection technique: single-shot Needle Needle type: Sprotte  Needle gauge: 24 G Needle length: 9 cm Assessment Sensory level: T4 Additional Notes Clear free flow CSF on first attempt.  No paresthesia.  Patient tolerated procedure well. Jasmine December, MD

## 2012-02-22 NOTE — Anesthesia Preprocedure Evaluation (Addendum)
Anesthesia Evaluation  Patient identified by MRN, date of birth, ID band Patient awake    Reviewed: Allergy & Precautions, H&P , NPO status , Patient's Chart, lab work & pertinent test results, reviewed documented beta blocker date and time   History of Anesthesia Complications Negative for: history of anesthetic complications  Airway Mallampati: III TM Distance: >3 FB Neck ROM: full    Dental  (+) Teeth Intact   Pulmonary neg pulmonary ROS,  breath sounds clear to auscultation        Cardiovascular negative cardio ROS  Rhythm:regular Rate:Normal     Neuro/Psych negative neurological ROS  negative psych ROS   GI/Hepatic negative GI ROS, Neg liver ROS,   Endo/Other  Morbid obesity  Renal/GU negative Renal ROS  negative genitourinary   Musculoskeletal   Abdominal   Peds  Hematology  (+) anemia ,   Anesthesia Other Findings Last ate full meal at 6:30 pm prior to arriving for cytotec induction  Reproductive/Obstetrics (+) Pregnancy (NRFHR)                           Anesthesia Physical Anesthesia Plan  ASA: II and Emergent  Anesthesia Plan: Spinal   Post-op Pain Management:    Induction:   Airway Management Planned:   Additional Equipment:   Intra-op Plan:   Post-operative Plan:   Informed Consent: I have reviewed the patients History and Physical, chart, labs and discussed the procedure including the risks, benefits and alternatives for the proposed anesthesia with the patient or authorized representative who has indicated his/her understanding and acceptance.   Dental Advisory Given  Plan Discussed with: CRNA and Surgeon  Anesthesia Plan Comments:        Anesthesia Quick Evaluation

## 2012-02-22 NOTE — Addendum Note (Signed)
Addendum  created 02/22/12 1521 by Renford Dills, CRNA   Modules edited:Notes Section

## 2012-02-22 NOTE — Progress Notes (Signed)
UR chart review completed.  

## 2012-02-22 NOTE — Transfer of Care (Signed)
Immediate Anesthesia Transfer of Care Note  Patient: Paula Sandoval  Procedure(s) Performed: Procedure(s) (LRB): CESAREAN SECTION (N/A)  Patient Location: PACU  Anesthesia Type: Spinal  Level of Consciousness: awake, alert  and oriented  Airway & Oxygen Therapy: Patient Spontanous Breathing  Post-op Assessment: Report given to PACU RN and Post -op Vital signs reviewed and stable  Post vital signs: stable  Complications: No apparent anesthesia complications

## 2012-02-22 NOTE — Anesthesia Postprocedure Evaluation (Signed)
  Anesthesia Post-op Note  Patient: Paula Sandoval  Procedure(s) Performed: Procedure(s) (LRB): CESAREAN SECTION (N/A)  Patient Location: Mother/Baby  Anesthesia Type: Spinal  Level of Consciousness: awake  Airway and Oxygen Therapy: Patient Spontanous Breathing  Post-op Pain: mild  Post-op Assessment: Patient's Cardiovascular Status Stable and Respiratory Function Stable  Post-op Vital Signs: stable  Complications: No apparent anesthesia complications

## 2012-02-22 NOTE — Addendum Note (Signed)
Addendum  created 02/22/12 0543 by Dana Allan, MD   Modules edited:Anesthesia Medication Administration, Charting, Inpatient Notes

## 2012-02-22 NOTE — Progress Notes (Signed)
Patient ID: Paula Sandoval, female   DOB: 1991-09-04, 21 y.o.   MRN: [id number] Vital signs normal incision clean and dry legs negative no complaints doing well

## 2012-02-23 LAB — CBC
MCV: 75.7 fL — ABNORMAL LOW (ref 78.0–100.0)
Platelets: 304 10*3/uL (ref 150–400)
RDW: 17.5 % — ABNORMAL HIGH (ref 11.5–15.5)
WBC: 17.9 10*3/uL — ABNORMAL HIGH (ref 4.0–10.5)

## 2012-02-23 NOTE — Progress Notes (Signed)
Called Dr. Gaynell Face in regards to Ancef order being continued or not.  Dr. Gaynell Face stated that Ancef IV could be discontinued now after the patient has had three doses.  Paula Sandoval, Linda Hedges [place]

## 2012-02-23 NOTE — Progress Notes (Signed)
Patient ID: Paula Sandoval, female   DOB: 1991/05/13, 21 y.o.   MRN: [id number] Postop day 1 Vital signs normal Fundus firm Legs negative Incision clean and dry no complaints

## 2012-02-24 MED ORDER — FERROUS SULFATE 325 (65 FE) MG PO TABS
325.0000 mg | ORAL_TABLET | Freq: Two times a day (BID) | ORAL | Status: DC
  Administered 2012-02-24 – 2012-02-25 (×3): 325 mg via ORAL
  Filled 2012-02-24 (×3): qty 1

## 2012-02-24 NOTE — Progress Notes (Signed)
Subjective: Postpartum Day 2: Cesarean Delivery Patient reports tolerating PO, + flatus and no problems voiding.  Up ad lib without problems.   Objective: Vital signs in last 24 hours: Temp:  [97.5 F (36.4 C)-98.5 F (36.9 C)] 98.5 F (36.9 C) (04/26 0600) Pulse Rate:  [93-106] 106  (04/26 0600) Resp:  [18] 18  (04/26 0600) BP: (101-108)/(64-68) 101/68 mmHg (04/26 0600)  Physical Exam:  General: alert, cooperative, appears stated age and no distress Lochia: appropriate Uterine Fundus: firm Incision: healing well, no significant drainage, no dehiscence, no significant erythema DVT Evaluation: No evidence of DVT seen on physical exam. Negative Homan's sign.   Basename 02/23/12 0542 02/21/12 2105  HGB 8.3* 9.8*  HCT 27.4* 32.6*    Assessment/Plan: Status post Cesarean section. Doing well postoperatively.  Continue current care. DC home tomorrow.   HAMBY, Latrese Carolan 02/24/2012, 10:23 AM

## 2012-02-25 MED ORDER — OXYCODONE-ACETAMINOPHEN 5-325 MG PO TABS: 1.0000 | ORAL_TABLET | ORAL | Status: AC | PRN

## 2012-02-25 MED ORDER — IBUPROFEN 600 MG PO TABS: 600.0000 mg | ORAL_TABLET | Freq: Four times a day (QID) | ORAL | Status: DC | PRN

## 2012-02-25 NOTE — Discharge Summary (Signed)
Obstetric Discharge Summary Reason for Admission: induction of labor Prenatal Procedures: ultrasound Intrapartum Procedures: cesarean: low cervical, transverse Postpartum Procedures: none Complications-Operative and Postpartum: none Hemoglobin  Date Value Range Status  02/23/2012 8.3* 12.0-15.0 (g/dL) Final     HCT  Date Value Range Status  02/23/2012 27.4* 36.0-46.0 (%) Final    Physical Exam:  General: alert and no distress Lochia: appropriate Uterine Fundus: firm Incision: healing well DVT Evaluation: No evidence of DVT seen on physical exam.  Discharge Diagnoses: Term Pregnancy-delivered  Discharge Information: Date: 02/25/2012 Activity: pelvic rest Diet: routine Medications: PNV, Ibuprofen, Colace and Percocet Condition: stable Instructions: refer to practice specific booklet Discharge to: home Follow-up Information    Schedule an appointment as soon as possible for a visit with Brock Bad, MD.   Contact information:   [address] [place] [address] [phone]          Newborn Data: Live born female  Birth Weight: 7 lb 15.9 oz (3625 g) APGAR: 8, 9  Home with mother.  Joda Braatz A 02/25/2012, 10:13 AM

## 2012-02-25 NOTE — Progress Notes (Signed)
Subjective: Postpartum Day 3: Cesarean Delivery Patient reports tolerating PO, + flatus, + BM and no problems voiding.    Objective: Vital signs in last 24 hours: Temp:  [97.9 F (36.6 C)-98.9 F (37.2 C)] 97.9 F (36.6 C) (04/27 0603) Pulse Rate:  [102-107] 107  (04/27 0603) Resp:  [18-20] 20  (04/27 0603) BP: (92-111)/(62-73) 101/70 mmHg (04/27 0603) SpO2:  [93 %-98 %] 98 % (04/26 2200) Weight:  [135.626 kg (299 lb)] 135.626 kg (299 lb) (04/27 0248)  Physical Exam:  General: alert and no distress Lochia: appropriate Uterine Fundus: firm Incision: healing well DVT Evaluation: No evidence of DVT seen on physical exam.   Basename 02/23/12 0542  HGB 8.3*  HCT 27.4*    Assessment/Plan: Status post Cesarean section. Doing well postoperatively.  Discharge home with standard precautions and return to clinic in 2 weeks.  Naser Schuld A 02/25/2012, 10:04 AM

## 2012-02-27 ENCOUNTER — Encounter (HOSPITAL_COMMUNITY): Payer: Self-pay | Admitting: Obstetrics

## 2013-08-06 ENCOUNTER — Other Ambulatory Visit: Payer: Self-pay | Admitting: Obstetrics

## 2013-08-06 DIAGNOSIS — N6310 Unspecified lump in the right breast, unspecified quadrant: Secondary | ICD-10-CM

## 2013-08-06 LAB — PROCEDURE REPORT - SCANNED: PAP SMEAR: NEGATIVE

## 2013-08-08 ENCOUNTER — Ambulatory Visit
Admission: RE | Admit: 2013-08-08 | Discharge: 2013-08-08 | Disposition: A | Discharge status: Home / Self Care | Payer: Medicaid Other | Source: Ambulatory Visit | Attending: Obstetrics | Admitting: Obstetrics

## 2013-08-08 DIAGNOSIS — N6310 Unspecified lump in the right breast, unspecified quadrant: Secondary | ICD-10-CM

## 2013-10-31 DIAGNOSIS — D499 Neoplasm of unspecified behavior of unspecified site: Secondary | ICD-10-CM

## 2013-10-31 HISTORY — DX: Neoplasm of unspecified behavior of unspecified site: D49.9

## 2014-09-01 ENCOUNTER — Encounter (HOSPITAL_COMMUNITY): Payer: Self-pay | Admitting: Obstetrics

## 2015-11-08 ENCOUNTER — Encounter (HOSPITAL_COMMUNITY): Payer: Self-pay | Admitting: *Deleted

## 2015-11-08 ENCOUNTER — Other Ambulatory Visit (HOSPITAL_COMMUNITY)
Admission: RE | Admit: 2015-11-08 | Discharge: 2015-11-08 | Disposition: A | Discharge status: Home / Self Care | Payer: Medicaid Other | Source: Ambulatory Visit | Attending: Emergency Medicine | Admitting: Emergency Medicine

## 2015-11-08 ENCOUNTER — Emergency Department (INDEPENDENT_AMBULATORY_CARE_PROVIDER_SITE_OTHER)
Admission: EM | Admit: 2015-11-08 | Discharge: 2015-11-08 | Disposition: A | Discharge status: Home / Self Care | Payer: Self-pay | Source: Home / Self Care | Attending: Emergency Medicine | Admitting: Emergency Medicine

## 2015-11-08 DIAGNOSIS — R1031 Right lower quadrant pain: Secondary | ICD-10-CM

## 2015-11-08 DIAGNOSIS — Z113 Encounter for screening for infections with a predominantly sexual mode of transmission: Secondary | ICD-10-CM | POA: Insufficient documentation

## 2015-11-08 DIAGNOSIS — N760 Acute vaginitis: Secondary | ICD-10-CM | POA: Insufficient documentation

## 2015-11-08 LAB — POCT PREGNANCY, URINE: PREG TEST UR: NEGATIVE

## 2015-11-08 NOTE — Discharge Instructions (Signed)
I suspect you have an ovarian cyst that is causing your intermittent pain. Your pregnancy test is negative today. Your urine is normal. Take Tylenol every 6-8 hours as needed for discomfort. Please make an appointment with your OB/GYN provider in the next 1-2 weeks for a recheck. If your period has not started in the next week, please do a home pregnancy test. We have collected swabs for testing. We will call if anything comes back positive. If at any time the pain becomes constant, severe, associated with nausea or vomiting, please go to the emergency room for immediate evaluation.

## 2015-11-08 NOTE — ED Provider Notes (Signed)
CSN: [id number]     Arrival date & time 11/08/15  1301 History   First MD Initiated Contact with Patient 11/08/15 1332     Chief Complaint  Patient presents with  . Abdominal Pain   (Consider location/radiation/quality/duration/timing/severity/associated sxs/prior Treatment) HPI  She is a 25 year old woman here for evaluation of right lower quadrant abdominal pain. She reports an intermittent crampy type pain to the right lower quadrant. This is been going on for about a week, it has been worse in the last 2-3 days. She reports pain is sharp, like menstrual cramps. Episodes last about 10 minutes. Episodes occur multiple times a day. It seems to be improved with lying flat. She has not tried any medications. She does report some nausea and one episode of vomiting 2 days ago, but thinks this came from a dip she ate. She denies any vaginal discharge. She does report some intermittent discomfort with urination, but states she does often hold her urine. She denies any constipation or diarrhea. No fevers. No new sexual partners. Her last period was December 8. She is not on any birth control.  Past Medical History  Diagnosis Date  . Asthma    Past Surgical History  Procedure Laterality Date  . Excision of tumor right leg  2009  . Cesarean section  02/22/2012    Procedure: CESAREAN SECTION;  Surgeon: Frederico Hamman, MD;  Location: [facility];  Service: Gynecology;  Laterality: N/A;   Family History  Problem Relation Age of Onset  . Anesthesia problems Neg Hx   . Hypotension Neg Hx   . Malignant hyperthermia Neg Hx   . Pseudochol deficiency Neg Hx    Social History  Substance Use Topics  . Smoking status: Never Smoker   . Smokeless tobacco: Never Used  . Alcohol Use: No   OB History    Gravida Para Term Preterm AB TAB SAB Ectopic Multiple Living   1 1 1       1      Review of Systems As in history of present illness Allergies  Review of patient's allergies indicates no known  allergies.  Home Medications   Prior to Admission medications   Medication Sig Start Date End Date Taking? Authorizing Provider  ibuprofen (ADVIL,MOTRIN) 600 MG tablet Take 1 tablet (600 mg total) by mouth every 6 (six) hours as needed. 02/25/12 03/06/12  Shelly Bombard, MD   Meds Ordered and Administered this Visit  Medications - No data to display  BP 108/74 mmHg  Pulse 88  Temp(Src) 98.3 F (36.8 C) (Oral)  Resp 20  SpO2 99%  LMP 10/08/2015 (Exact Date)  Breastfeeding? No No data found.   Physical Exam  Constitutional: She appears well-developed and well-nourished. No distress.  Cardiovascular: Normal rate.   Pulmonary/Chest: Effort normal.  Abdominal: Soft. Bowel sounds are normal. She exhibits no distension and no mass. There is no tenderness. There is no rebound and no guarding.  Genitourinary: There is no rash on the right labia. There is no rash on the left labia. Cervix exhibits no motion tenderness, no discharge and no friability. Right adnexum displays tenderness and fullness. Left adnexum displays no fullness. No bleeding in the vagina. No foreign body around the vagina. No vaginal discharge found.    ED Course  Procedures (including critical care time)  Labs Review Labs Reviewed  CERVICOVAGINAL ANCILLARY ONLY   Visual read of UA is negative. UPT is negative.  Imaging Review No results found.    MDM  1. Abdominal pain, RLQ    No sign of appendicitis or urinary tract infection. Based on bimanual exam, I suspect her pain is coming from an ovarian cyst. Recommended follow-up with her OB/GYN provider in the next 1-2 weeks for follow-up. Recommended use of Tylenol as needed for pain. If her period has not started in the next week, she will repeat a home pregnancy test. If she develops constant or worsening pain, pain associated with nausea or vomiting, she will go to the emergency room for immediate reevaluation.    Melony Overly, MD 11/08/15 [phone]

## 2015-11-08 NOTE — ED Notes (Signed)
C/O intermittent RLQ abd cramping x 1 wk.  Started with nausea 2 days ago.  Denies voimiting or fevers.  Denies any pain at present.

## 2015-11-09 LAB — CERVICOVAGINAL ANCILLARY ONLY
Chlamydia: NEGATIVE
NEISSERIA GONORRHEA: NEGATIVE

## 2015-11-10 LAB — CERVICOVAGINAL ANCILLARY ONLY: Wet Prep (BD Affirm): POSITIVE — AB

## 2015-11-16 ENCOUNTER — Telehealth (HOSPITAL_COMMUNITY): Payer: Self-pay | Admitting: Emergency Medicine

## 2015-11-16 MED ORDER — METRONIDAZOLE 500 MG PO TABS: 500.0000 mg | ORAL_TABLET | Freq: Two times a day (BID) | ORAL | Status: DC

## 2015-11-16 NOTE — ED Notes (Signed)
Called and spoke w/pt... After pt ID'd, notified pt of recent lab results... Neg for Gon/Chlam ... Pos for Gardnerella. Pt reports she is feeling better since visit.  Reviewed w/Frank Saralyn Pilar, PA who e-Rx Flagyl to pt's preferred Pharmacy ([facility]). (Flagyl 500mg  1 tab, BID x7 days) Adv pt to complete full treatment and to avoid alcohol... Pt verb understanding.

## 2015-11-16 NOTE — Progress Notes (Signed)
Review of vaginal culture report.  +'ve gardnella  No treatment noted on chart rx for flagyl sent to pharmacy of pt's choice.

## 2015-12-19 ENCOUNTER — Emergency Department (INDEPENDENT_AMBULATORY_CARE_PROVIDER_SITE_OTHER): Payer: BLUE CROSS/BLUE SHIELD

## 2015-12-19 ENCOUNTER — Emergency Department (INDEPENDENT_AMBULATORY_CARE_PROVIDER_SITE_OTHER)
Admission: EM | Admit: 2015-12-19 | Discharge: 2015-12-19 | Disposition: A | Discharge status: Home / Self Care | Payer: Self-pay | Source: Home / Self Care | Attending: Emergency Medicine | Admitting: Emergency Medicine

## 2015-12-19 ENCOUNTER — Encounter (HOSPITAL_COMMUNITY): Payer: Self-pay | Admitting: Emergency Medicine

## 2015-12-19 DIAGNOSIS — J069 Acute upper respiratory infection, unspecified: Secondary | ICD-10-CM

## 2015-12-19 MED ORDER — ACETAMINOPHEN 325 MG PO TABS
ORAL_TABLET | ORAL | Status: AC
  Filled 2015-12-19: qty 3

## 2015-12-19 MED ORDER — IPRATROPIUM-ALBUTEROL 0.5-2.5 (3) MG/3ML IN SOLN
RESPIRATORY_TRACT | Status: AC
  Filled 2015-12-19: qty 3

## 2015-12-19 MED ORDER — ACETAMINOPHEN 325 MG PO TABS
975.0000 mg | ORAL_TABLET | Freq: Once | ORAL | Status: AC
  Administered 2015-12-19: 975 mg via ORAL

## 2015-12-19 MED ORDER — IPRATROPIUM-ALBUTEROL 0.5-2.5 (3) MG/3ML IN SOLN
3.0000 mL | Freq: Once | RESPIRATORY_TRACT | Status: AC
  Administered 2015-12-19: 3 mL via RESPIRATORY_TRACT

## 2015-12-19 MED ORDER — ALBUTEROL SULFATE HFA 108 (90 BASE) MCG/ACT IN AERS: 2.0000 | INHALATION_SPRAY | RESPIRATORY_TRACT | Status: DC | PRN

## 2015-12-19 NOTE — Discharge Instructions (Signed)
Upper Respiratory Infection, Adult For nasal and head congestion may take Sudafed PE 10 mg every 4 hours as needed. Saline nasal spray used frequently. For drainage may use Allegra, Claritin or Zyrtec. If you need stronger medicine to stop drainage may take Chlor-Trimeton 2-4 mg every 4 hours. This may cause drowsiness. Ibuprofen 600 mg every 6 hours as needed for pain, discomfort or fever. Drink plenty of fluids and stay well-hydrated. Albuterol HFA inhaler 2 puffs every 4 hours as needed for cough and wheezing. Stop smoking  Most upper respiratory infections (URIs) are a viral infection of the air passages leading to the lungs. A URI affects the nose, throat, and upper air passages. The most common type of URI is nasopharyngitis and is typically referred to as "the common cold." URIs run their course and usually go away on their own. Most of the time, a URI does not require medical attention, but sometimes a bacterial infection in the upper airways can follow a viral infection. This is called a secondary infection. Sinus and middle ear infections are common types of secondary upper respiratory infections. Bacterial pneumonia can also complicate a URI. A URI can worsen asthma and chronic obstructive pulmonary disease (COPD). Sometimes, these complications can require emergency medical care and may be life threatening.  CAUSES Almost all URIs are caused by viruses. A virus is a type of germ and can spread from one person to another.  RISKS FACTORS You may be at risk for a URI if:   You smoke.   You have chronic heart or lung disease.  You have a weakened defense (immune) system.   You are very young or very old.   You have nasal allergies or asthma.  You work in crowded or poorly ventilated areas.  You work in health care facilities or schools. SIGNS AND SYMPTOMS  Symptoms typically develop 2-3 days after you come in contact with a cold virus. Most viral URIs last 7-10 days.  However, viral URIs from the influenza virus (flu virus) can last 14-18 days and are typically more severe. Symptoms may include:   Runny or stuffy (congested) nose.   Sneezing.   Cough.   Sore throat.   Headache.   Fatigue.   Fever.   Loss of appetite.   Pain in your forehead, behind your eyes, and over your cheekbones (sinus pain).  Muscle aches.  DIAGNOSIS  Your health care provider may diagnose a URI by:  Physical exam.  Tests to check that your symptoms are not due to another condition such as:  Strep throat.  Sinusitis.  Pneumonia.  Asthma. TREATMENT  A URI goes away on its own with time. It cannot be cured with medicines, but medicines may be prescribed or recommended to relieve symptoms. Medicines may help:  Reduce your fever.  Reduce your cough.  Relieve nasal congestion. HOME CARE INSTRUCTIONS   Take medicines only as directed by your health care provider.   Gargle warm saltwater or take cough drops to comfort your throat as directed by your health care provider.  Use a warm mist humidifier or inhale steam from a shower to increase air moisture. This may make it easier to breathe.  Drink enough fluid to keep your urine clear or pale yellow.   Eat soups and other clear broths and maintain good nutrition.   Rest as needed.   Return to work when your temperature has returned to normal or as your health care provider advises. You may need to stay home  longer to avoid infecting others. You can also use a face mask and careful hand washing to prevent spread of the virus.  Increase the usage of your inhaler if you have asthma.   Do not use any tobacco products, including cigarettes, chewing tobacco, or electronic cigarettes. If you need help quitting, ask your health care provider. PREVENTION  The best way to protect yourself from getting a cold is to practice good hygiene.   Avoid oral or hand contact with people with cold symptoms.    Wash your hands often if contact occurs.  There is no clear evidence that vitamin C, vitamin E, echinacea, or exercise reduces the chance of developing a cold. However, it is always recommended to get plenty of rest, exercise, and practice good nutrition.  SEEK MEDICAL CARE IF:   You are getting worse rather than better.   Your symptoms are not controlled by medicine.   You have chills.  You have worsening shortness of breath.  You have brown or red mucus.  You have yellow or brown nasal discharge.  You have pain in your face, especially when you bend forward.  You have a fever.  You have swollen neck glands.  You have pain while swallowing.  You have white areas in the back of your throat. SEEK IMMEDIATE MEDICAL CARE IF:   You have severe or persistent:  Headache.  Ear pain.  Sinus pain.  Chest pain.  You have chronic lung disease and any of the following:  Wheezing.  Prolonged cough.  Coughing up blood.  A change in your usual mucus.  You have a stiff neck.  You have changes in your:  Vision.  Hearing.  Thinking.  Mood. MAKE SURE YOU:   Understand these instructions.  Will watch your condition.  Will get help right away if you are not doing well or get worse.   This information is not intended to replace advice given to you by your health care provider. Make sure you discuss any questions you have with your health care provider.   Document Released: 04/12/2001 Document Revised: 03/03/2015 Document Reviewed: 01/22/2014 Elsevier Interactive Patient Education [employer].

## 2015-12-19 NOTE — ED Notes (Signed)
C/o cold sx onset yest associated w/fevers, dry cough, dizziness, fatigue A&O x4... No acute distress.

## 2015-12-19 NOTE — ED Provider Notes (Signed)
CSN: [id number]     Arrival date & time 12/19/15  1315 History   First MD Initiated Contact with Patient 12/19/15 1500     Chief Complaint  Patient presents with  . URI   (Consider location/radiation/quality/duration/timing/severity/associated sxs/prior Treatment) HPI Comments: 25 year old female complaining of a sore throat which started yesterday. Associated with cough, feeling hot and cold and fever. She states MAXIMUM TEMPERATURE was 109. She has taken TheraFlu. She states she smokes occasionally. No adult history of asthma.   Past Medical History  Diagnosis Date  . Asthma    Past Surgical History  Procedure Laterality Date  . Excision of tumor right leg  2009  . Cesarean section  02/22/2012    Procedure: CESAREAN SECTION;  Surgeon: Frederico Hamman, MD;  Location: [facility];  Service: Gynecology;  Laterality: N/A;   Family History  Problem Relation Age of Onset  . Anesthesia problems Neg Hx   . Hypotension Neg Hx   . Malignant hyperthermia Neg Hx   . Pseudochol deficiency Neg Hx    Social History  Substance Use Topics  . Smoking status: Never Smoker   . Smokeless tobacco: Never Used  . Alcohol Use: No   OB History    Gravida Para Term Preterm AB TAB SAB Ectopic Multiple Living   1 1 1       1      Review of Systems  Constitutional: Positive for fever and activity change. Negative for chills, appetite change and fatigue.  HENT: Positive for congestion and sore throat. Negative for facial swelling, postnasal drip and rhinorrhea.   Eyes: Negative.   Respiratory: Positive for cough. Negative for shortness of breath.   Cardiovascular: Negative.   Musculoskeletal: Negative for neck pain and neck stiffness.  Skin: Negative for pallor and rash.  Neurological: Negative.   All other systems reviewed and are negative.   Allergies  Review of patient's allergies indicates no known allergies.  Home Medications   Prior to Admission medications   Medication Sig Start Date  End Date Taking? Authorizing Provider  albuterol (PROVENTIL HFA;VENTOLIN HFA) 108 (90 Base) MCG/ACT inhaler Inhale 2 puffs into the lungs every 4 (four) hours as needed for wheezing or shortness of breath. 12/19/15   Janne Napoleon, NP  ibuprofen (ADVIL,MOTRIN) 600 MG tablet Take 1 tablet (600 mg total) by mouth every 6 (six) hours as needed. 02/25/12 03/06/12  Shelly Bombard, MD  metroNIDAZOLE (FLAGYL) 500 MG tablet Take 1 tablet (500 mg total) by mouth 2 (two) times daily. 11/16/15   Konrad Felix, PA   Meds Ordered and Administered this Visit   Medications  acetaminophen (TYLENOL) tablet 975 mg (975 mg Oral Given 12/19/15 1344)  ipratropium-albuterol (DUONEB) 0.5-2.5 (3) MG/3ML nebulizer solution 3 mL (3 mLs Nebulization Given 12/19/15 1549)    BP 111/68 mmHg  Pulse 132  Temp(Src) 103.1 F (39.5 C) (Oral)  SpO2 100%  LMP 12/06/2015 No data found.   Physical Exam  Constitutional: She appears well-developed and well-nourished. No distress.  HENT:  Mouth/Throat: No oropharyngeal exudate.  Bilateral TMs are normal. Oropharynx with small amount of clear PND. Otherwise clear.  Eyes: Conjunctivae and EOM are normal.  Neck: Normal range of motion. Neck supple.  Cardiovascular: Normal rate, regular rhythm and normal heart sounds.   Pulmonary/Chest: Effort normal.  Bilateral Expiratory rhonchi and rare wheeze. Prolonged expiratory phase.  Musculoskeletal: Normal range of motion. She exhibits no edema.  Lymphadenopathy:    She has no cervical adenopathy.  Neurological: She  is alert. She exhibits normal muscle tone. Coordination normal.  Skin: Skin is warm and dry.  Psychiatric: She has a normal mood and affect.  Nursing note and vitals reviewed.   ED Course  Procedures (including critical care time)  Labs Review Labs Reviewed - No data to display  Imaging Review Dg Chest 2 View  12/19/2015  CLINICAL DATA:  Pt has cough, chest pain, fever, x 2 days, no hx of pneumonia or bronchitis,  hx of asthma as a child, smoker EXAM: CHEST  2 VIEW COMPARISON:  None. FINDINGS: The heart size and mediastinal contours are within normal limits. Both lungs are clear. No pleural effusion or pneumothorax. The visualized skeletal structures are unremarkable. IMPRESSION: Normal chest radiographs. Electronically Signed   By: Lajean Manes M.D.   On: 12/19/2015 15:47     Visual Acuity Review  Right Eye Distance:   Left Eye Distance:   Bilateral Distance:    Right Eye Near:   Left Eye Near:    Bilateral Near:         MDM   1. URI (upper respiratory infection)    post DuoNeb 2.5/5 mg patient's lungs are clear. No wheezing. Good air movement.   Upper Respiratory Infection, Adult For nasal and head congestion may take Sudafed PE 10 mg every 4 hours as needed. Saline nasal spray used frequently. For drainage may use Allegra, Claritin or Zyrtec. If you need stronger medicine to stop drainage may take Chlor-Trimeton 2-4 mg every 4 hours. This may cause drowsiness. Ibuprofen 600 mg every 6 hours as needed for pain, discomfort or fever. Drink plenty of fluids and stay well-hydrated. Albuterol HFA inhaler 2 puffs every 4 hours as needed for cough and wheezing. Stop smoking    Janne Napoleon, NP 12/19/15 1558  Janne Napoleon, NP 12/19/15 [phone]

## 2016-02-28 IMAGING — DX DG CHEST 2V
2 series · 2 of 2 positions shown · non-contrast
Comparison: None.

CLINICAL DATA: Pt has cough, chest pain, fever, x 2 days, no hx of
pneumonia or bronchitis, hx of asthma as a child, smoker

EXAM:
CHEST  2 VIEW

[chest pa]
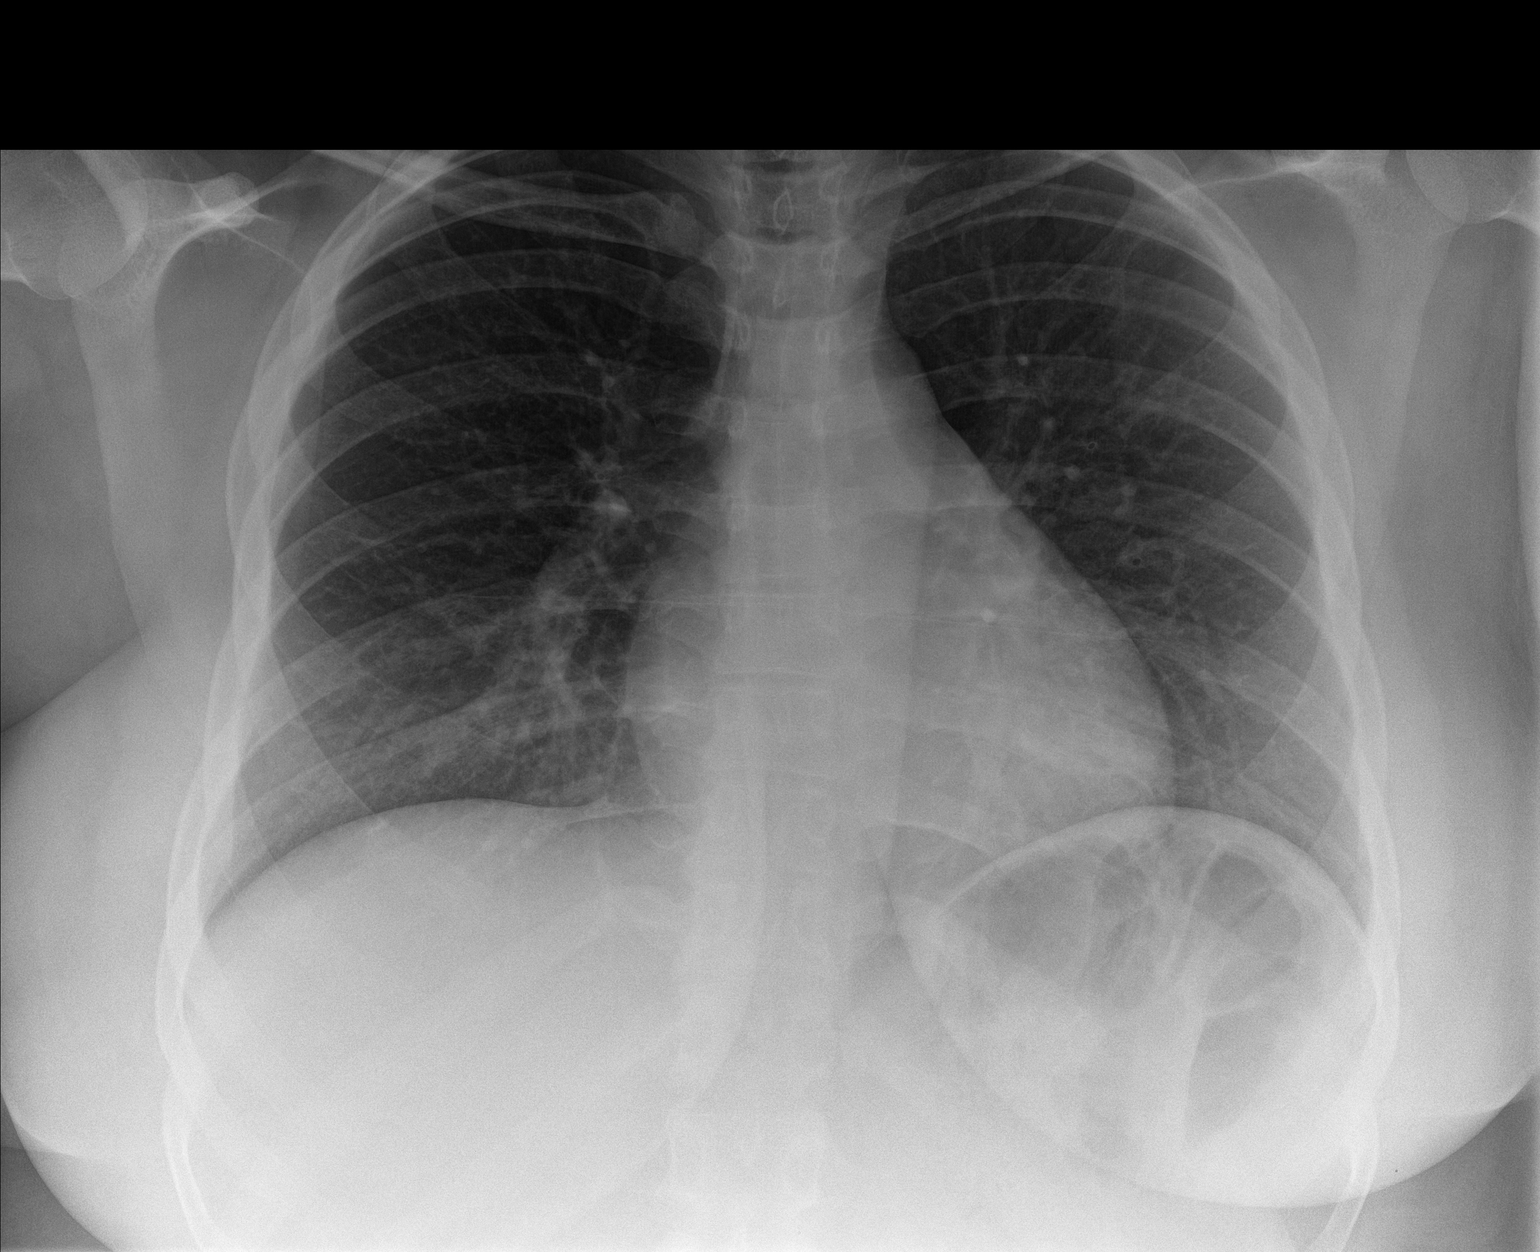

[chest lat]
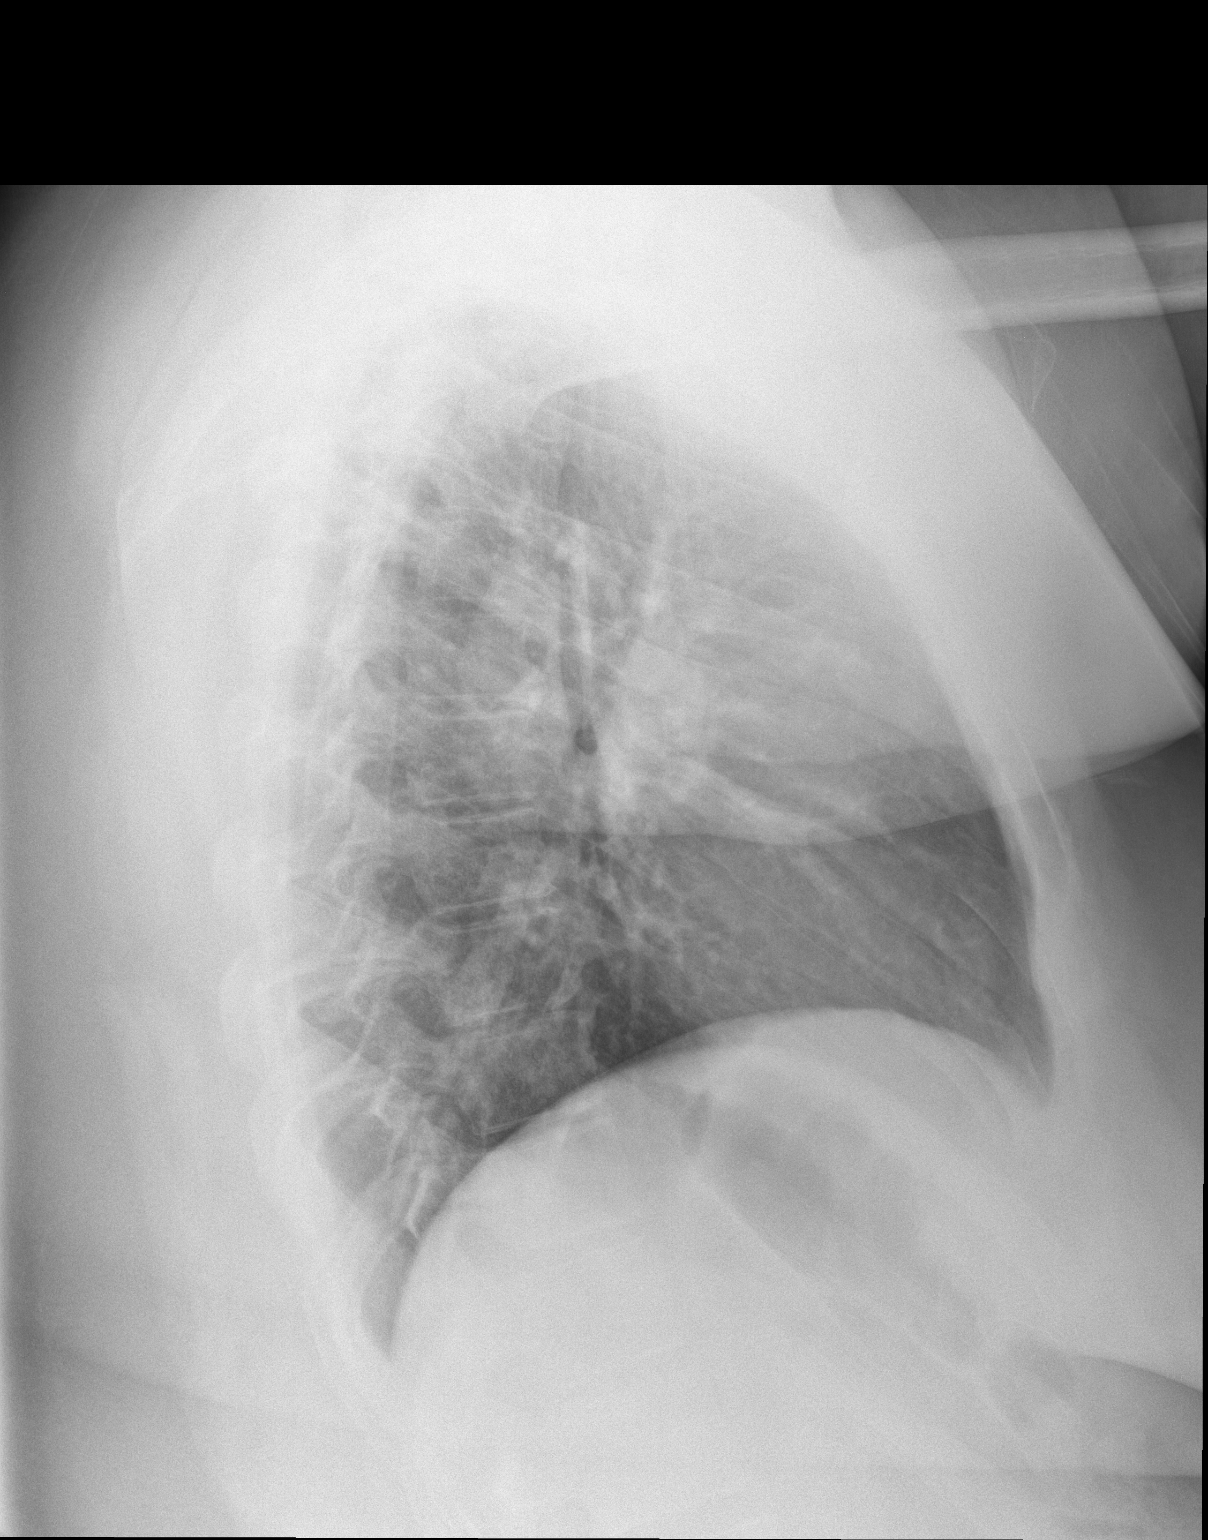

[2 of 2 positions shown; findings below may reference images not displayed]

FINDINGS: The heart size and mediastinal contours are within normal limits.
Both lungs are clear. No pleural effusion or pneumothorax. The
visualized skeletal structures are unremarkable.
IMPRESSION: Normal chest radiographs.

## 2016-04-18 ENCOUNTER — Encounter (HOSPITAL_COMMUNITY): Payer: Self-pay | Admitting: Emergency Medicine

## 2016-04-18 ENCOUNTER — Ambulatory Visit (HOSPITAL_COMMUNITY)
Admission: EM | Admit: 2016-04-18 | Discharge: 2016-04-18 | Disposition: A | Discharge status: Home / Self Care | Payer: BLUE CROSS/BLUE SHIELD | Attending: Emergency Medicine | Admitting: Emergency Medicine

## 2016-04-18 DIAGNOSIS — R112 Nausea with vomiting, unspecified: Secondary | ICD-10-CM

## 2016-04-18 LAB — POCT URINALYSIS DIP (DEVICE)
Bilirubin Urine: NEGATIVE
GLUCOSE, UA: NEGATIVE mg/dL
Hgb urine dipstick: NEGATIVE
Ketones, ur: NEGATIVE mg/dL
LEUKOCYTES UA: NEGATIVE
NITRITE: NEGATIVE
Protein, ur: NEGATIVE mg/dL
Specific Gravity, Urine: 1.02 (ref 1.005–1.030)
UROBILINOGEN UA: 1 mg/dL (ref 0.0–1.0)
pH: 7 (ref 5.0–8.0)

## 2016-04-18 LAB — POCT PREGNANCY, URINE: Preg Test, Ur: NEGATIVE

## 2016-04-18 MED ORDER — ONDANSETRON HCL 4 MG PO TABS: 8.0000 mg | ORAL_TABLET | Freq: Three times a day (TID) | ORAL | Status: DC | PRN

## 2016-04-18 NOTE — ED Provider Notes (Signed)
HPI  SUBJECTIVE:  Paula Sandoval is a 25 y.o. female who presents with nausea for the past 2-3 days. She reports one episode of nonbilious nonbloody emesis yesterday and 2 today. She is tolerating fluids. She states that she feels bloated, but denies abdominal pain. Symptoms are better with vomiting, no other aggravating or alleviating factors. She has not tried anything for this. No fevers, abdominal distention, urinary urgency, frequency, cloudy or odorous urine, hematuria. No chest pain, shortness of breath, headache, vertigo. She reports back pain, but states that this is not new and has not changed today. No diarrhea, change in urine output, vaginal complaints. No brought or undercooked foods, questionable leftovers. She does not take any medications on a regular basis. No sick contacts with similar symptoms. No exposure to chickens, Turtles, petting disease. She had a normal bowel movement this morning. She states that she has been eating a lot more Schrempf recently. Past medical history negative for gallbladder disease, abdominal surgeries, diabetes, hypertension, peptic ulcer disease, MI, arrhythmia, GERD, alcohol use. LMP: 5/15, PMD: None.   Past Medical History  Diagnosis Date  . Asthma     Past Surgical History  Procedure Laterality Date  . Excision of tumor right leg  2009  . Cesarean section  02/22/2012    Procedure: CESAREAN SECTION;  Surgeon: Frederico Hamman, MD;  Location: [facility];  Service: Gynecology;  Laterality: N/A;    Family History  Problem Relation Age of Onset  . Anesthesia problems Neg Hx   . Hypotension Neg Hx   . Malignant hyperthermia Neg Hx   . Pseudochol deficiency Neg Hx     Social History  Substance Use Topics  . Smoking status: Never Smoker   . Smokeless tobacco: Never Used  . Alcohol Use: No    No current facility-administered medications for this encounter.  Current outpatient prescriptions:  .  albuterol (PROVENTIL HFA;VENTOLIN HFA) 108  (90 Base) MCG/ACT inhaler, Inhale 2 puffs into the lungs every 4 (four) hours as needed for wheezing or shortness of breath., Disp: 1 Inhaler, Rfl: 0 .  ondansetron (ZOFRAN) 4 MG tablet, Take 2 tablets (8 mg total) by mouth every 8 (eight) hours as needed for nausea or vomiting., Disp: 20 tablet, Rfl: 0 .  [DISCONTINUED] ibuprofen (ADVIL,MOTRIN) 600 MG tablet, Take 1 tablet (600 mg total) by mouth every 6 (six) hours as needed., Disp: 30 tablet, Rfl: 5  No Known Allergies   ROS  As noted in HPI.   Physical Exam  BP 126/91 mmHg  Pulse 88  Temp(Src) 98.9 F (37.2 C) (Oral)  Resp 18  SpO2 100%  LMP 02/29/2016  Constitutional: Well developed, well nourished, no acute distress. Appears well-hydrated Eyes: PERRL, EOMI, conjunctiva normal bilaterally HENT: Normocephalic, atraumatic,mucus membranes moist Respiratory: Clear to auscultation bilaterally, no rales, no wheezing, no rhonchi Cardiovascular: Normal rate and rhythm, no murmurs, no gallops, no rubs GI: Soft, nondistended, normal bowel sounds, nontender, no rebound, no guarding Back: no CVAT skin: No rash, skin intact Musculoskeletal: No edema, no tenderness, no deformities Neurologic: Alert & oriented x 3, CN II-XII grossly intact, no motor deficits, sensation grossly intact Psychiatric: Speech and behavior appropriate   ED Course   Medications - No data to display  Orders Placed This Encounter  Procedures  . POCT urinalysis dip (device)    Standing Status: Standing     Number of Occurrences: 1     Standing Expiration Date:   . Pregnancy, urine POC    Standing Status:  Standing     Number of Occurrences: 1     Standing Expiration Date:    Results for orders placed or performed during the hospital encounter of 04/18/16 (from the past 24 hour(s))  POCT urinalysis dip (device)     Status: None   Collection Time: 04/18/16  6:18 PM  Result Value Ref Range   Glucose, UA NEGATIVE NEGATIVE mg/dL   Bilirubin Urine NEGATIVE  NEGATIVE   Ketones, ur NEGATIVE NEGATIVE mg/dL   Specific Gravity, Urine 1.020 1.005 - 1.030   Hgb urine dipstick NEGATIVE NEGATIVE   pH 7.0 5.0 - 8.0   Protein, ur NEGATIVE NEGATIVE mg/dL   Urobilinogen, UA 1.0 0.0 - 1.0 mg/dL   Nitrite NEGATIVE NEGATIVE   Leukocytes, UA NEGATIVE NEGATIVE  Pregnancy, urine POC     Status: None   Collection Time: 04/18/16  6:24 PM  Result Value Ref Range   Preg Test, Ur NEGATIVE NEGATIVE   No results found.  ED Clinical Impression  Non-intractable vomiting with nausea, vomiting of unspecified type   ED Assessment/Plan  Patient is not pregnant. UA negative for UTI. Unsure as to etiology of her nausea, but it does not appear to be a life-threatening cause at this time. There is no evidence of dehydration. We'll send home with Zofran, bland diet, discontinue shrimp. Will have her follow up with the PMD of her choice as needed. Providing primary care referral list.   Discussed labs,  MDM, plan and followup with patient. Discussed sn/sx that should prompt return to the  ED. Patient agrees with plan.   *This clinic note was created using Dragon dictation software. Therefore, there may be occasional mistakes despite careful proofreading.  ?  Melynda Ripple, MD 04/19/16 [phone]

## 2016-04-18 NOTE — ED Notes (Signed)
Patient reports a 3 day history of vomiting and low back pain.  Reports 2 episodes of vomiting today. Denies urinary symptoms, denies vaginal discharge.  Last bm was today.  No diarrhea.

## 2016-04-20 ENCOUNTER — Ambulatory Visit (INDEPENDENT_AMBULATORY_CARE_PROVIDER_SITE_OTHER): Payer: BLUE CROSS/BLUE SHIELD | Admitting: General Practice

## 2016-04-20 ENCOUNTER — Encounter: Payer: Self-pay | Admitting: General Practice

## 2016-04-20 DIAGNOSIS — Z3201 Encounter for pregnancy test, result positive: Secondary | ICD-10-CM | POA: Diagnosis not present

## 2016-04-20 LAB — POCT PREGNANCY, URINE: Preg Test, Ur: POSITIVE — AB

## 2016-04-20 NOTE — Progress Notes (Signed)
Patient here for pregnancy test today. upt +. Patient reports first positive home test 6/19. LMP 5/16 EDD 12-20-16 [redacted]w[redacted]d today. Patient desires to start care here. Encouraged patient to make appt around 11 weeks & to take PNV. Patient verbalized understanding & had no questions

## 2016-05-10 ENCOUNTER — Telehealth: Payer: Self-pay | Admitting: *Deleted

## 2016-05-10 NOTE — Telephone Encounter (Signed)
Pt called and stated that she would like to get an rx for nausea meds. Her first prenatal visit is at the end of the month.

## 2016-05-25 NOTE — Telephone Encounter (Signed)
Patient has upcoming appointment on 05/26/2016. We will address her concerns at that time

## 2016-05-26 ENCOUNTER — Encounter: Payer: Self-pay | Admitting: Obstetrics & Gynecology

## 2016-05-26 ENCOUNTER — Ambulatory Visit (INDEPENDENT_AMBULATORY_CARE_PROVIDER_SITE_OTHER): Payer: BLUE CROSS/BLUE SHIELD | Admitting: Obstetrics & Gynecology

## 2016-05-26 DIAGNOSIS — Z3490 Encounter for supervision of normal pregnancy, unspecified, unspecified trimester: Secondary | ICD-10-CM | POA: Insufficient documentation

## 2016-05-26 DIAGNOSIS — Z3491 Encounter for supervision of normal pregnancy, unspecified, first trimester: Secondary | ICD-10-CM | POA: Diagnosis not present

## 2016-05-26 DIAGNOSIS — Z113 Encounter for screening for infections with a predominantly sexual mode of transmission: Secondary | ICD-10-CM | POA: Diagnosis not present

## 2016-05-26 DIAGNOSIS — Z6841 Body Mass Index (BMI) 40.0 and over, adult: Secondary | ICD-10-CM

## 2016-05-26 LAB — POCT URINALYSIS DIP (DEVICE)
BILIRUBIN URINE: NEGATIVE
Glucose, UA: NEGATIVE mg/dL
Hgb urine dipstick: NEGATIVE
Ketones, ur: NEGATIVE mg/dL
LEUKOCYTES UA: NEGATIVE
NITRITE: NEGATIVE
PH: 7 (ref 5.0–8.0)
Protein, ur: NEGATIVE mg/dL
Specific Gravity, Urine: 1.02 (ref 1.005–1.030)
UROBILINOGEN UA: 1 mg/dL (ref 0.0–1.0)

## 2016-05-26 MED ORDER — PANTOPRAZOLE SODIUM 40 MG PO TBEC: 40.0000 mg | DELAYED_RELEASE_TABLET | Freq: Every day | ORAL | 7 refills | Status: DC

## 2016-05-26 NOTE — Patient Instructions (Signed)
First Trimester of Pregnancy The first trimester of pregnancy is from week 1 until the end of week 12 (months 1 through 3). A week after a sperm fertilizes an egg, the egg will implant on the wall of the uterus. This embryo will begin to develop into a baby. Genes from you and your partner are forming the baby. The female genes determine whether the baby is a boy or a girl. At 6-8 weeks, the eyes and face are formed, and the heartbeat can be seen on ultrasound. At the end of 12 weeks, all the baby's organs are formed.  Now that you are pregnant, you will want to do everything you can to have a healthy baby. Two of the most important things are to get good prenatal care and to follow your health care provider's instructions. Prenatal care is all the medical care you receive before the baby's birth. This care will help prevent, find, and treat any problems during the pregnancy and childbirth. BODY CHANGES Your body goes through many changes during pregnancy. The changes vary from woman to woman.   You may gain or lose a couple of pounds at first.  You may feel sick to your stomach (nauseous) and throw up (vomit). If the vomiting is uncontrollable, call your health care provider.  You may tire easily.  You may develop headaches that can be relieved by medicines approved by your health care provider.  You may urinate more often. Painful urination may mean you have a bladder infection.  You may develop heartburn as a result of your pregnancy.  You may develop constipation because certain hormones are causing the muscles that push waste through your intestines to slow down.  You may develop hemorrhoids or swollen, bulging veins (varicose veins).  Your breasts may begin to grow larger and become tender. Your nipples may stick out more, and the tissue that surrounds them (areola) may become darker.  Your gums may bleed and may be sensitive to brushing and flossing.  Dark spots or blotches (chloasma,  mask of pregnancy) may develop on your face. This will likely fade after the baby is born.  Your menstrual periods will stop.  You may have a loss of appetite.  You may develop cravings for certain kinds of food.  You may have changes in your emotions from day to day, such as being excited to be pregnant or being concerned that something may go wrong with the pregnancy and baby.  You may have more vivid and strange dreams.  You may have changes in your hair. These can include thickening of your hair, rapid growth, and changes in texture. Some women also have hair loss during or after pregnancy, or hair that feels dry or thin. Your hair will most likely return to normal after your baby is born. WHAT TO EXPECT AT YOUR PRENATAL VISITS During a routine prenatal visit:  You will be weighed to make sure you and the baby are growing normally.  Your blood pressure will be taken.  Your abdomen will be measured to track your baby's growth.  The fetal heartbeat will be listened to starting around week 10 or 12 of your pregnancy.  Test results from any previous visits will be discussed. Your health care provider may ask you:  How you are feeling.  If you are feeling the baby move.  If you have had any abnormal symptoms, such as leaking fluid, bleeding, severe headaches, or abdominal cramping.  If you are using any tobacco products,   including cigarettes, chewing tobacco, and electronic cigarettes.  If you have any questions. Other tests that may be performed during your first trimester include:  Blood tests to find your blood type and to check for the presence of any previous infections. They will also be used to check for low iron levels (anemia) and Rh antibodies. Later in the pregnancy, blood tests for diabetes will be done along with other tests if problems develop.  Urine tests to check for infections, diabetes, or protein in the urine.  An ultrasound to confirm the proper growth  and development of the baby.  An amniocentesis to check for possible genetic problems.  Fetal screens for spina bifida and Down syndrome.  You may need other tests to make sure you and the baby are doing well.  HIV (human immunodeficiency virus) testing. Routine prenatal testing includes screening for HIV, unless you choose not to have this test. HOME CARE INSTRUCTIONS  Medicines  Follow your health care provider's instructions regarding medicine use. Specific medicines may be either safe or unsafe to take during pregnancy.  Take your prenatal vitamins as directed.  If you develop constipation, try taking a stool softener if your health care provider approves. Diet  Eat regular, well-balanced meals. Choose a variety of foods, such as meat or vegetable-based protein, fish, milk and low-fat dairy products, vegetables, fruits, and whole grain breads and cereals. Your health care provider will help you determine the amount of weight gain that is right for you.  Avoid raw meat and uncooked cheese. These carry germs that can cause birth defects in the baby.  Eating four or five small meals rather than three large meals a day may help relieve nausea and vomiting. If you start to feel nauseous, eating a few soda crackers can be helpful. Drinking liquids between meals instead of during meals also seems to help nausea and vomiting.  If you develop constipation, eat more high-fiber foods, such as fresh vegetables or fruit and whole grains. Drink enough fluids to keep your urine clear or pale yellow. Activity and Exercise  Exercise only as directed by your health care provider. Exercising will help you:  Control your weight.  Stay in shape.  Be prepared for labor and delivery.  Experiencing pain or cramping in the lower abdomen or low back is a good sign that you should stop exercising. Check with your health care provider before continuing normal exercises.  Try to avoid standing for long  periods of time. Move your legs often if you must stand in one place for a long time.  Avoid heavy lifting.  Wear low-heeled shoes, and practice good posture.  You may continue to have sex unless your health care provider directs you otherwise. Relief of Pain or Discomfort  Wear a good support bra for breast tenderness.   Take warm sitz baths to soothe any pain or discomfort caused by hemorrhoids. Use hemorrhoid cream if your health care provider approves.   Rest with your legs elevated if you have leg cramps or low back pain.  If you develop varicose veins in your legs, wear support hose. Elevate your feet for 15 minutes, 3-4 times a day. Limit salt in your diet. Prenatal Care  Schedule your prenatal visits by the twelfth week of pregnancy. They are usually scheduled monthly at first, then more often in the last 2 months before delivery.  Write down your questions. Take them to your prenatal visits.  Keep all your prenatal visits as directed by your   health care provider. Safety  Wear your seat belt at all times when driving.  Make a list of emergency phone numbers, including numbers for family, friends, the hospital, and police and fire departments. General Tips  Ask your health care provider for a referral to a local prenatal education class. Begin classes no later than at the beginning of month 6 of your pregnancy.  Ask for help if you have counseling or nutritional needs during pregnancy. Your health care provider can offer advice or refer you to specialists for help with various needs.  Do not use hot tubs, steam rooms, or saunas.  Do not douche or use tampons or scented sanitary pads.  Do not cross your legs for long periods of time.  Avoid cat litter boxes and soil used by cats. These carry germs that can cause birth defects in the baby and possibly loss of the fetus by miscarriage or stillbirth.  Avoid all smoking, herbs, alcohol, and medicines not prescribed by  your health care provider. Chemicals in these affect the formation and growth of the baby.  Do not use any tobacco products, including cigarettes, chewing tobacco, and electronic cigarettes. If you need help quitting, ask your health care provider. You may receive counseling support and other resources to help you quit.  Schedule a dentist appointment. At home, brush your teeth with a soft toothbrush and be gentle when you floss. SEEK MEDICAL CARE IF:   You have dizziness.  You have mild pelvic cramps, pelvic pressure, or nagging pain in the abdominal area.  You have persistent nausea, vomiting, or diarrhea.  You have a bad smelling vaginal discharge.  You have pain with urination.  You notice increased swelling in your face, hands, legs, or ankles. SEEK IMMEDIATE MEDICAL CARE IF:   You have a fever.  You are leaking fluid from your vagina.  You have spotting or bleeding from your vagina.  You have severe abdominal cramping or pain.  You have rapid weight gain or loss.  You vomit blood or material that looks like coffee grounds.  You are exposed to German measles and have never had them.  You are exposed to fifth disease or chickenpox.  You develop a severe headache.  You have shortness of breath.  You have any kind of trauma, such as from a fall or a car accident.   This information is not intended to replace advice given to you by your health care provider. Make sure you discuss any questions you have with your health care provider.   Document Released: 10/11/2001 Document Revised: 11/07/2014 Document Reviewed: 08/27/2013 Elsevier Interactive Patient Education 2016 [employer]  

## 2016-05-26 NOTE — Progress Notes (Signed)
Here for initial prenatal visit. Given new patient booklets.   Subjective:     Paula Sandoval is a G2P1001 [redacted]w[redacted]d being seen today for her first obstetrical visit.  Her obstetrical history is significant for obesity and prevoius cesarean section. Patient does intend to breast feed. Pregnancy history fully reviewed.  Patient reports heartburn, nausea and vomiting.  Vitals:   05/26/16 1321 05/26/16 1325  BP: 101/71   Pulse: (!) 102   Weight: (!) 322 lb 14.4 oz (146.5 kg)   Height:  5' 9.5" (1.765 m)    HISTORY: OB History  Gravida Para Term Preterm AB Living  2 1 1     1   SAB TAB Ectopic Multiple Live Births          1    # Outcome Date GA Lbr Len/2nd Weight Sex Delivery Anes PTL Lv  2 Current           1 Term 02/22/12 [redacted]w[redacted]d   M CS-LTranv Spinal  LIV     Past Medical History:  Diagnosis Date  . Asthma   . 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 CESAREAN SECTION  02/22/2012   Procedure: CESAREAN SECTION;  Surgeon: Frederico Hamman, MD;  Location: [facility];  Service: Gynecology;  Laterality: N/A;  . excision of tumor right leg  2009   Family History  Problem Relation Age of Onset  . Anesthesia problems Neg Hx   . Hypotension Neg Hx   . Malignant hyperthermia Neg Hx   . Pseudochol deficiency Neg Hx      Exam    Uterus:     Pelvic Exam:    Perineum: No Hemorrhoids   Vulva: normal   Vagina:  normal mucosa   pH:     Cervix: no lesions   Adnexa: normal adnexa   Bony Pelvis: average  System: Breast:  normal appearance, no masses or tenderness   Skin: normal coloration and turgor, no rashes    Neurologic: oriented, normal mood   Extremities: normal strength, tone, and muscle mass   HEENT PERRLA   Mouth/Teeth dental hygiene good   Neck supple   Cardiovascular: regular rate and rhythm, no murmurs or gallops   Respiratory:  appears well, vitals normal, no respiratory distress, acyanotic, normal RR, chest clear, no  wheezing, crepitations, rhonchi, normal symmetric air entry   Abdomen: obese no mass   Urinary: urethral meatus normal      Assessment:    Pregnancy: G2P1001 Patient Active Problem List   Diagnosis Date Noted  . Supervision of low-risk pregnancy 05/26/2016  . Morbid obesity with BMI of 45.0-49.9, adult ([place]) 05/26/2016        Plan:     Initial labs drawn. Prenatal vitamins. Problem list reviewed and updated. Genetic Screening discussed First Screen: requested.  Ultrasound discussed; fetal survey: 18 weeks.  Follow up in 4 weeks. 50% of 30 min visit spent on counseling and coordination of care.  Protonix for reflux sx   ARNOLD,JAMES 05/26/2016

## 2016-05-27 LAB — PRENATAL PROFILE (SOLSTAS)
Antibody Screen: NEGATIVE
BASOS ABS: 0 {cells}/uL (ref 0–200)
Basophils Relative: 0 %
EOS PCT: 2 %
Eosinophils Absolute: 284 cells/uL (ref 15–500)
HCT: 37.6 % (ref 35.0–45.0)
HEP B S AG: NEGATIVE
HIV: NONREACTIVE
Hemoglobin: 11.9 g/dL (ref 11.7–15.5)
LYMPHS ABS: 2556 {cells}/uL (ref 850–3900)
Lymphocytes Relative: 18 %
MCH: 25.1 pg — ABNORMAL LOW (ref 27.0–33.0)
MCHC: 31.6 g/dL — AB (ref 32.0–36.0)
MCV: 79.3 fL — ABNORMAL LOW (ref 80.0–100.0)
MPV: 9.4 fL (ref 7.5–12.5)
Monocytes Absolute: 852 cells/uL (ref 200–950)
Monocytes Relative: 6 %
NEUTROS ABS: 10508 {cells}/uL — AB (ref 1500–7800)
Neutrophils Relative %: 74 %
PLATELETS: 392 10*3/uL (ref 140–400)
RBC: 4.74 MIL/uL (ref 3.80–5.10)
RDW: 15.4 % — AB (ref 11.0–15.0)
RH TYPE: POSITIVE
Rubella: 7.76 Index — ABNORMAL HIGH (ref <0.90)
WBC: 14.2 10*3/uL — ABNORMAL HIGH (ref 3.8–10.8)

## 2016-05-27 LAB — GLUCOSE TOLERANCE, 1 HOUR (50G) W/O FASTING: GLUCOSE, 1 HR, GESTATIONAL: 118 mg/dL (ref <140)

## 2016-05-29 LAB — PAIN MGMT, PROFILE 6 CONF W/O MM, U
6 ACETYLMORPHINE: NEGATIVE ng/mL (ref <10)
Alcohol Metabolites: NEGATIVE ng/mL (ref <500)
Amphetamines: NEGATIVE ng/mL (ref <500)
Barbiturates: NEGATIVE ng/mL (ref <300)
Benzodiazepines: NEGATIVE ng/mL (ref <100)
Cocaine Metabolite: NEGATIVE ng/mL (ref <150)
Creatinine: 159.4 mg/dL (ref >=20.0)
MARIJUANA METABOLITE: NEGATIVE ng/mL (ref <20)
Methadone Metabolite: NEGATIVE ng/mL (ref <100)
OXYCODONE: NEGATIVE ng/mL (ref <100)
Opiates: NEGATIVE ng/mL (ref <100)
Oxidant: NEGATIVE ug/mL (ref <200)
PLEASE NOTE: 0
Phencyclidine: NEGATIVE ng/mL (ref <25)
pH: 7.07 (ref 4.5–9.0)

## 2016-05-29 LAB — CULTURE, OB URINE

## 2016-05-30 LAB — HEMOGLOBINOPATHY EVALUATION
HEMATOCRIT: 37.6 % (ref 35.0–45.0)
HEMOGLOBIN: 11.9 g/dL (ref 11.7–15.5)
Hgb A2 Quant: 2.1 % (ref 1.8–3.5)
Hgb A: 96.9 % (ref >96.0)
Hgb F Quant: <1 % (ref <2.0)
MCH: 25.1 pg — AB (ref 27.0–33.0)
MCV: 79.3 fL — AB (ref 80.0–100.0)
RBC: 4.74 MIL/uL (ref 3.80–5.10)
RDW: 15.4 % — AB (ref 11.0–15.0)

## 2016-05-30 LAB — GC/CHLAMYDIA PROBE AMP (~~LOC~~) NOT AT ARMC
Chlamydia: NEGATIVE
NEISSERIA GONORRHEA: NEGATIVE

## 2016-05-30 LAB — CYTOLOGY - PAP

## 2016-06-10 ENCOUNTER — Encounter (HOSPITAL_COMMUNITY): Payer: Self-pay | Admitting: Obstetrics & Gynecology

## 2016-06-14 ENCOUNTER — Other Ambulatory Visit (HOSPITAL_COMMUNITY): Payer: BLUE CROSS/BLUE SHIELD

## 2016-06-15 ENCOUNTER — Ambulatory Visit (HOSPITAL_COMMUNITY)
Admission: RE | Admit: 2016-06-15 | Discharge: 2016-06-15 | Disposition: A | Discharge status: Home / Self Care | Payer: BLUE CROSS/BLUE SHIELD | Source: Ambulatory Visit | Attending: Obstetrics & Gynecology | Admitting: Obstetrics & Gynecology

## 2016-06-15 ENCOUNTER — Other Ambulatory Visit: Payer: Self-pay | Admitting: Obstetrics & Gynecology

## 2016-06-15 ENCOUNTER — Encounter (HOSPITAL_COMMUNITY): Payer: Self-pay

## 2016-06-15 VITALS — BP 113/70 | HR 118 | Wt 327.2 lb

## 2016-06-15 DIAGNOSIS — Z3A00 Weeks of gestation of pregnancy not specified: Secondary | ICD-10-CM | POA: Insufficient documentation

## 2016-06-15 DIAGNOSIS — Z3689 Encounter for other specified antenatal screening: Secondary | ICD-10-CM

## 2016-06-15 DIAGNOSIS — Z3491 Encounter for supervision of normal pregnancy, unspecified, first trimester: Secondary | ICD-10-CM | POA: Diagnosis present

## 2016-06-15 DIAGNOSIS — Z36 Encounter for antenatal screening of mother: Secondary | ICD-10-CM | POA: Insufficient documentation

## 2016-06-22 ENCOUNTER — Other Ambulatory Visit (HOSPITAL_COMMUNITY): Payer: Self-pay

## 2016-06-24 ENCOUNTER — Inpatient Hospital Stay (HOSPITAL_COMMUNITY)
Admission: AD | Admit: 2016-06-24 | Discharge: 2016-06-24 | Disposition: A | Discharge status: Home / Self Care | Payer: BLUE CROSS/BLUE SHIELD | Source: Ambulatory Visit | Attending: Obstetrics & Gynecology | Admitting: Obstetrics & Gynecology

## 2016-06-24 ENCOUNTER — Encounter (HOSPITAL_COMMUNITY): Payer: Self-pay | Admitting: *Deleted

## 2016-06-24 DIAGNOSIS — N949 Unspecified condition associated with female genital organs and menstrual cycle: Secondary | ICD-10-CM

## 2016-06-24 DIAGNOSIS — O99512 Diseases of the respiratory system complicating pregnancy, second trimester: Secondary | ICD-10-CM | POA: Insufficient documentation

## 2016-06-24 DIAGNOSIS — O26892 Other specified pregnancy related conditions, second trimester: Secondary | ICD-10-CM | POA: Diagnosis not present

## 2016-06-24 DIAGNOSIS — Z3A14 14 weeks gestation of pregnancy: Secondary | ICD-10-CM | POA: Diagnosis not present

## 2016-06-24 DIAGNOSIS — O210 Mild hyperemesis gravidarum: Secondary | ICD-10-CM | POA: Diagnosis not present

## 2016-06-24 DIAGNOSIS — Z9889 Other specified postprocedural states: Secondary | ICD-10-CM | POA: Insufficient documentation

## 2016-06-24 DIAGNOSIS — Z79899 Other long term (current) drug therapy: Secondary | ICD-10-CM | POA: Diagnosis not present

## 2016-06-24 DIAGNOSIS — Z87891 Personal history of nicotine dependence: Secondary | ICD-10-CM | POA: Insufficient documentation

## 2016-06-24 DIAGNOSIS — R51 Headache: Secondary | ICD-10-CM | POA: Insufficient documentation

## 2016-06-24 LAB — URINALYSIS, ROUTINE W REFLEX MICROSCOPIC
Bilirubin Urine: NEGATIVE
Glucose, UA: NEGATIVE mg/dL
Hgb urine dipstick: NEGATIVE
Ketones, ur: NEGATIVE mg/dL
Leukocytes, UA: NEGATIVE
NITRITE: NEGATIVE
PROTEIN: NEGATIVE mg/dL
SPECIFIC GRAVITY, URINE: 1.015 (ref 1.005–1.030)
pH: 7.5 (ref 5.0–8.0)

## 2016-06-24 MED ORDER — DEXAMETHASONE SODIUM PHOSPHATE 10 MG/ML IJ SOLN
10.0000 mg | Freq: Once | INTRAMUSCULAR | Status: AC
  Administered 2016-06-24: 10 mg via INTRAVENOUS
  Filled 2016-06-24: qty 1

## 2016-06-24 MED ORDER — PROMETHAZINE HCL 12.5 MG PO TABS
12.5000 mg | ORAL_TABLET | Freq: Four times a day (QID) | ORAL | 0 refills | Status: DC | PRN
Start: 2016-06-24 — End: 2016-07-11

## 2016-06-24 MED ORDER — METOCLOPRAMIDE HCL 5 MG/ML IJ SOLN
10.0000 mg | Freq: Once | INTRAMUSCULAR | Status: AC
  Administered 2016-06-24: 10 mg via INTRAVENOUS
  Filled 2016-06-24: qty 2

## 2016-06-24 MED ORDER — DIPHENHYDRAMINE HCL 50 MG/ML IJ SOLN
25.0000 mg | Freq: Once | INTRAMUSCULAR | Status: AC
  Administered 2016-06-24: 25 mg via INTRAVENOUS
  Filled 2016-06-24: qty 1

## 2016-06-24 MED ORDER — BUTALBITAL-APAP-CAFFEINE 50-325-40 MG PO TABS: 1.0000 | ORAL_TABLET | Freq: Four times a day (QID) | ORAL | 0 refills | Status: DC | PRN

## 2016-06-24 MED ORDER — SODIUM CHLORIDE 0.9 % IV BOLUS (SEPSIS)
1000.0000 mL | Freq: Once | INTRAVENOUS | Status: AC
  Administered 2016-06-24: 1000 mL via INTRAVENOUS

## 2016-06-24 NOTE — Discharge Instructions (Signed)
Migraine Headache A migraine headache is very bad, throbbing pain on one or both sides of your head. Talk to your doctor about what things may bring on (trigger) your migraine headaches. HOME CARE  Only take medicines as told by your doctor.  Lie down in a dark, quiet room when you have a migraine.  Keep a journal to find out if certain things bring on migraine headaches. For example, write down:  What you eat and drink.  How much sleep you get.  Any change to your diet or medicines.  Lessen how much alcohol you drink.  Quit smoking if you smoke.  Get enough sleep.  Lessen any stress in your life.  Keep lights dim if bright lights bother you or make your migraines worse. GET HELP RIGHT AWAY IF:   Your migraine becomes really bad.  You have a fever.  You have a stiff neck.  You have trouble seeing.  Your muscles are weak, or you lose muscle control.  You lose your balance or have trouble walking.  You feel like you will pass out (faint), or you pass out.  You have really bad symptoms that are different than your first symptoms. MAKE SURE YOU:   Understand these instructions.  Will watch your condition.  Will get help right away if you are not doing well or get worse.   This information is not intended to replace advice given to you by your health care provider. Make sure you discuss any questions you have with your health care provider.   Document Released: 07/26/2008 Document Revised: 01/09/2012 Document Reviewed: 06/24/2013 Elsevier Interactive Patient Education 2016 [employer].   Round Ligament Pain During Pregnancy   Round ligament pain is a sharp pain or jabbing feeling often felt in the lower belly or groin area on one or both sides. It is one of the most common complaints during pregnancy and is considered a normal part of pregnancy. It is most often felt during the second trimester.   Here is what you need to know about round ligament pain,  including some tips to help you feel better.   Causes of Round Ligament Pain:    Several thick ligaments surround and support your womb (uterus) as it grows during pregnancy. One of them is called the round ligament.   The round ligament connects the front part of the womb to your groin, the area where your legs attach to your pelvis. The round ligament normally tightens and relaxes slowly.   As your baby and womb grow, the round ligament stretches. That makes it more likely to become strained.   Sudden movements can cause the ligament to tighten quickly, like a rubber band snapping. This causes a sudden and quick jabbing feeling.   Symptoms of Round Ligament Pain   Round ligament pain can be concerning and uncomfortable. But it is considered normal as your body changes during pregnancy.   The symptoms of round ligament pain include a sharp, sudden spasm in the belly. It usually affects the right side, but it may happen on both sides. The pain only lasts a few seconds.   Exercise may cause the pain, as will rapid movements such as:   sneezing  coughing  laughing  rolling over in bed  standing up too quickly   Treatment of Round Ligament Pain   Here are some tips that may help reduce your discomfort:   Pain relief. Take over-the-counter acetaminophen for pain, if necessary. Ask your doctor if  this is OK.   Exercise. Get plenty of exercise to keep your stomach (core) muscles strong. Doing stretching exercises or prenatal yoga can be helpful. Ask your doctor which exercises are safe for you and your baby.   A helpful exercise involves putting your hands and knees on the floor, lowering your head, and pushing your backside into the air.   Avoid sudden movements. Change positions slowly (such as standing up or sitting down) to avoid sudden movements that may cause stretching and pain.   Flex your hips. Bend and flex your hips before you cough, sneeze, or laugh to avoid pulling on the  ligaments.   Apply warmth. A heating pad or warm bath may be helpful. Ask your doctor if this is OK. Extreme heat can be dangerous to the baby.   You should try to modify your daily activity level and avoid positions that may worsen the condition.   When to Call the Doctor/Midwife   Always tell your doctor or midwife about any type of pain you have during pregnancy. Round ligament pain is quick and doesn't last long.   Call your health care provider immediately if you have:   severe pain  fever  chills  pain on urination  difficulty walking   Belly pain during pregnancy can be due to many different causes. It is important for your doctor to rule out more serious conditions, including pregnancy complications such as placenta abruption or non-pregnancy illnesses such as:   inguinal hernia  appendicitis  stomach, liver, and kidney problems  Preterm labor pains may sometimes be mistaken for round ligament pain.

## 2016-06-24 NOTE — MAU Note (Signed)
Patient developed a headache and n/v yesterday.  After vomiting, patient had abdominal cramping that has continued today.  Denies vaginal bleeding or discharge.

## 2016-06-24 NOTE — MAU Provider Note (Signed)
History     CSN: [id number]  Arrival date and time: 06/24/16 1241   First Provider Initiated Contact with Patient 06/24/16 1321      Chief Complaint  Patient presents with  . Morning Sickness  . Abdominal Cramping   HPI  Paula Sandoval is a 25 y.o. G2P1001 at [redacted]w[redacted]d who presents to MAU today with complaint of headache, N/V and lower abdominal cramping since last night. She states that headache presented first. She took Tylenol without relief. She rates headache currently at 7/10. She has not taken anything since last night for pain. She states she then had N/V associated with the pain. She has had one episode of emesis today. She states cramping started after N/V last night. It was worse initially. She rates this pain at 7/10 now as well. She denies vaginal bleeding, discharge or UTI symptoms. She states pain is worse with ambulation, standing and change of positions.   OB History    Gravida Para Term Preterm AB Living   2 1 1     1    SAB TAB Ectopic Multiple Live Births           1      Past Medical History:  Diagnosis Date  . Asthma   . 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 CESAREAN SECTION  02/22/2012   Procedure: CESAREAN SECTION;  Surgeon: Frederico Hamman, MD;  Location: [facility];  Service: Gynecology;  Laterality: N/A;  . excision of tumor right leg  2009    Family History  Problem Relation Age of Onset  . Anesthesia problems Neg Hx   . Hypotension Neg Hx   . Malignant hyperthermia Neg Hx   . Pseudochol deficiency Neg Hx     Social History  Substance Use Topics  . Smoking status: Former Smoker    Types: Cigarettes    Quit date: 2008  . Smokeless tobacco: Never Used  . Alcohol use No    Allergies: No Known Allergies  Prescriptions Prior to Admission  Medication Sig Dispense Refill Last Dose  . Prenatal Vit-Fe Fumarate-FA (PRENATAL MULTIVITAMIN) TABS tablet Take 1 tablet by mouth daily at 12 noon.      Marland Kitchen albuterol (PROVENTIL HFA;VENTOLIN HFA) 108 (90 Base) MCG/ACT inhaler Inhale 2 puffs into the lungs every 4 (four) hours as needed for wheezing or shortness of breath. 1 Inhaler 0 rescue    Review of Systems  Constitutional: Negative for fever and malaise/fatigue.  Gastrointestinal: Positive for abdominal pain, nausea and vomiting. Negative for constipation and diarrhea.  Genitourinary: Negative for dysuria, frequency and urgency.       Neg - vaginal bleeding, discharge  Neurological: Positive for headaches.   Physical Exam   Blood pressure 97/56, pulse 111, temperature 98.4 F (36.9 C), temperature source Oral, height 6' (1.829 m), last menstrual period 03/15/2016, SpO2 98 %.  Physical Exam  Nursing note and vitals reviewed. Constitutional: She is oriented to person, place, and time. She appears well-developed and well-nourished. No distress.  HENT:  Head: Normocephalic and atraumatic.  Cardiovascular: Regular rhythm and normal heart sounds.  Tachycardia present.   Respiratory: Effort normal and breath sounds normal. No respiratory distress. She has no wheezes.  GI: Soft. Bowel sounds are normal. She exhibits no distension and no mass. There is tenderness (mild tenderness to palpation of the lower abdomen bilaterally). There is no rebound and no guarding.  Neurological: She is alert  and oriented to person, place, and time.  Skin: Skin is warm and dry. No erythema.  Psychiatric: She has a normal mood and affect.  Dilation: Closed Effacement (%): Thick Cervical Position: Posterior Exam by:: Kerry Hough, PA-C   Results for orders placed or performed during the hospital encounter of 06/24/16 (from the past 24 hour(s))  Urinalysis, Routine w reflex microscopic (not at [facility])     Status: None   Collection Time: 06/24/16  2:35 PM  Result Value Ref Range   Color, Urine YELLOW YELLOW   APPearance CLEAR CLEAR   Specific Gravity, Urine 1.015 1.005 - 1.030   pH 7.5 5.0 - 8.0    Glucose, UA NEGATIVE NEGATIVE mg/dL   Hgb urine dipstick NEGATIVE NEGATIVE   Bilirubin Urine NEGATIVE NEGATIVE   Ketones, ur NEGATIVE NEGATIVE mg/dL   Protein, ur NEGATIVE NEGATIVE mg/dL   Nitrite NEGATIVE NEGATIVE   Leukocytes, UA NEGATIVE NEGATIVE    MAU Course  Procedures None  MDM FHR - 156 bpm with doppler UA today - unable to give sample upon arrival IV NS bolus, 25 mg Benadryl, 10 mg Reglan and 10 mg Decadron given for headache and nausea.   Assessment and Plan  A: [facility] at [redacted]w[redacted]d Round ligament pain Headache with associated N/V   P: Discharge home Rx for Fioricet and Phenergan given to patient  Second trimester precautions discussed Patient advised to follow-up with [facility] as scheduled for routine prenatal care or sooner PRN Patient may return to MAU as needed or if her condition were to change or worsen   Luvenia Redden, PA-C  06/24/2016, 3:18 PM

## 2016-06-27 ENCOUNTER — Other Ambulatory Visit: Payer: Self-pay | Admitting: Family Medicine

## 2016-06-27 ENCOUNTER — Encounter: Payer: Self-pay | Admitting: Family Medicine

## 2016-06-27 DIAGNOSIS — Z3492 Encounter for supervision of normal pregnancy, unspecified, second trimester: Secondary | ICD-10-CM

## 2016-06-27 DIAGNOSIS — O34219 Maternal care for unspecified type scar from previous cesarean delivery: Secondary | ICD-10-CM | POA: Insufficient documentation

## 2016-06-29 ENCOUNTER — Ambulatory Visit (INDEPENDENT_AMBULATORY_CARE_PROVIDER_SITE_OTHER): Payer: BLUE CROSS/BLUE SHIELD | Admitting: Advanced Practice Midwife

## 2016-06-29 VITALS — BP 123/63 | HR 89 | Wt 324.2 lb

## 2016-06-29 DIAGNOSIS — O34219 Maternal care for unspecified type scar from previous cesarean delivery: Secondary | ICD-10-CM

## 2016-06-29 DIAGNOSIS — Z3483 Encounter for supervision of other normal pregnancy, third trimester: Secondary | ICD-10-CM

## 2016-06-29 LAB — POCT URINALYSIS DIP (DEVICE)
BILIRUBIN URINE: NEGATIVE
Glucose, UA: NEGATIVE mg/dL
HGB URINE DIPSTICK: NEGATIVE
KETONES UR: NEGATIVE mg/dL
LEUKOCYTES UA: NEGATIVE
NITRITE: NEGATIVE
PH: 7 (ref 5.0–8.0)
Protein, ur: NEGATIVE mg/dL
SPECIFIC GRAVITY, URINE: 1.015 (ref 1.005–1.030)
Urobilinogen, UA: 0.2 mg/dL (ref 0.0–1.0)

## 2016-06-29 NOTE — Progress Notes (Signed)
   PRENATAL VISIT NOTE  Subjective:  Paula Sandoval is a 25 y.o. G2P1001 at [redacted]w[redacted]d being seen today for ongoing prenatal care.  She is currently monitored for the following issues for this high-risk pregnancy and has Supervision of low-risk pregnancy; Morbid obesity with BMI of 45.0-49.9, adult [facility]); and Previous cesarean delivery, antepartum condition or complication on her problem list.  Patient reports no complaints.  Contractions: Not present. Vag. Bleeding: None.  Movement: Present. Denies leaking of fluid.   The following portions of the patient's history were reviewed and updated as appropriate: allergies, current medications, past family history, past medical history, past social history, past surgical history and problem list. Problem list updated.  Objective:   Vitals:   06/29/16 0851  BP: 123/63  Pulse: 89  Weight: (!) 324 lb 3.2 oz (147.1 kg)    Fetal Status: Fetal Heart Rate (bpm): 147   Movement: Present     General:  Alert, oriented and cooperative. Patient is in no acute distress.  Skin: Skin is warm and dry. No rash noted.   Cardiovascular: Normal heart rate noted  Respiratory: Normal respiratory effort, no problems with respiration noted  Abdomen: Soft, gravid, appropriate for gestational age. Pain/Pressure: Present     Pelvic:  Cervical exam deferred        Extremities: Normal range of motion.  Edema: Trace  Mental Status: Normal mood and affect. Normal behavior. Normal judgment and thought content.   Urinalysis:      Assessment and Plan:  Pregnancy: G2P1001 at [redacted]w[redacted]d  Discussed TOLAC. Wants to try TOLAC  Preterm labor symptoms and general obstetric precautions including but not limited to vaginal bleeding, contractions, leaking of fluid and fetal movement were reviewed in detail with the patient. Please refer to After Visit Summary for other counseling recommendations.  RTC 4 weeks  Seabron Spates, CNM

## 2016-06-29 NOTE — Patient Instructions (Signed)

## 2016-07-11 ENCOUNTER — Other Ambulatory Visit: Payer: Self-pay | Admitting: Medical

## 2016-07-14 ENCOUNTER — Other Ambulatory Visit: Payer: Self-pay | Admitting: Medical

## 2016-07-15 ENCOUNTER — Other Ambulatory Visit (HOSPITAL_COMMUNITY): Payer: Self-pay | Admitting: Medical

## 2016-07-15 ENCOUNTER — Other Ambulatory Visit: Payer: Self-pay | Admitting: Medical

## 2016-07-27 ENCOUNTER — Other Ambulatory Visit (HOSPITAL_COMMUNITY): Payer: Self-pay | Admitting: Obstetrics and Gynecology

## 2016-07-27 ENCOUNTER — Encounter: Payer: Self-pay | Admitting: Family Medicine

## 2016-07-27 ENCOUNTER — Encounter: Payer: Self-pay | Admitting: *Deleted

## 2016-07-27 ENCOUNTER — Ambulatory Visit (HOSPITAL_COMMUNITY)
Admission: RE | Admit: 2016-07-27 | Discharge: 2016-07-27 | Disposition: A | Discharge status: Home / Self Care | Payer: BLUE CROSS/BLUE SHIELD | Source: Ambulatory Visit | Attending: Obstetrics & Gynecology | Admitting: Obstetrics & Gynecology

## 2016-07-27 ENCOUNTER — Ambulatory Visit (INDEPENDENT_AMBULATORY_CARE_PROVIDER_SITE_OTHER): Payer: BLUE CROSS/BLUE SHIELD | Admitting: Advanced Practice Midwife

## 2016-07-27 VITALS — BP 113/72 | HR 115 | Wt 329.8 lb

## 2016-07-27 DIAGNOSIS — Z3689 Encounter for other specified antenatal screening: Secondary | ICD-10-CM

## 2016-07-27 DIAGNOSIS — Z36 Encounter for antenatal screening of mother: Secondary | ICD-10-CM | POA: Insufficient documentation

## 2016-07-27 DIAGNOSIS — Z3A19 19 weeks gestation of pregnancy: Secondary | ICD-10-CM | POA: Diagnosis not present

## 2016-07-27 DIAGNOSIS — O99212 Obesity complicating pregnancy, second trimester: Secondary | ICD-10-CM | POA: Diagnosis not present

## 2016-07-27 DIAGNOSIS — O34219 Maternal care for unspecified type scar from previous cesarean delivery: Secondary | ICD-10-CM | POA: Insufficient documentation

## 2016-07-27 DIAGNOSIS — Z23 Encounter for immunization: Secondary | ICD-10-CM | POA: Diagnosis not present

## 2016-07-27 DIAGNOSIS — Z3492 Encounter for supervision of normal pregnancy, unspecified, second trimester: Secondary | ICD-10-CM

## 2016-07-27 NOTE — Progress Notes (Signed)
   PRENATAL VISIT NOTE  Subjective:  Paula Sandoval is a 24 y.o. G2P1001 at [redacted]w[redacted]d being seen today for ongoing prenatal care.  She is currently monitored for the following issues for this low-risk pregnancy and has Supervision of low-risk pregnancy; Morbid obesity with BMI of 45.0-49.9, adult ([place]); and Previous cesarean delivery, antepartum condition or complication on her problem list.  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 Denies calf pain, warmth.  Contractions: Not present. Vag. Bleeding: None.  Movement: Present. Denies leaking of fluid.   The following portions of the patient's history were reviewed and updated as appropriate: allergies, current medications, past family history, past medical history, past social history, past surgical history and problem list. Problem list updated.  Objective:   Vitals:   07/27/16 0941  BP: 113/72  Pulse: (!) 115  Weight: (!) 329 lb 12.8 oz (149.6 kg)    Fetal Status: Fetal Heart Rate (bpm): 151 Fundal Height: 20 cm Movement: Present     General:  Alert, oriented and cooperative. Patient is in no acute distress.  Skin: Skin is warm and dry. No rash noted.   Cardiovascular: Normal heart rate noted  Respiratory: Normal respiratory effort, no problems with respiration noted  Abdomen: Soft, gravid, appropriate for gestational age. Pain/Pressure: Present     Pelvic:  Cervical exam deferred        Extremities: Normal range of motion.  Edema: Trace  Mental Status: Normal mood and affect. Normal behavior. Normal judgment and thought content.   Urinalysis:      Assessment and Plan:  Pregnancy: G2P1001 at [redacted]w[redacted]d  1. Needs flu shot  - Flu Vaccine QUAD 36+ mos IM (Fluarix, Quad PF)  2. Supervision of normal pregnancy, second trimester   Preterm labor symptoms and general obstetric precautions including but not limited to vaginal bleeding, contractions, leaking of fluid and fetal movement were reviewed in  detail with the patient. Please refer to After Visit Summary for other counseling recommendations.  Return in 4 weeks (on 08/24/2016).  Manya Silvas, CNM

## 2016-07-27 NOTE — Progress Notes (Signed)
Flu vaccine today 

## 2016-07-27 NOTE — Patient Instructions (Addendum)
Second Trimester of Pregnancy The second trimester is from week 13 through week 28, months 4 through 6. The second trimester is often a time when you feel your best. Your body has also adjusted to being pregnant, and you begin to feel better physically. Usually, morning sickness has lessened or quit completely, you may have more energy, and you may have an increase in appetite. The second trimester is also a time when the fetus is growing rapidly. At the end of the sixth month, the fetus is about 9 inches long and weighs about 1 pounds. You will likely begin to feel the baby move (quickening) between 18 and 20 weeks of the pregnancy. BODY CHANGES Your body goes through many changes during pregnancy. The changes vary from woman to woman.   Your weight will continue to increase. You will notice your lower abdomen bulging out.  You may begin to get stretch marks on your hips, abdomen, and breasts.  You may develop headaches that can be relieved by medicines approved by your health care provider.  You may urinate more often because the fetus is pressing on your bladder.  You may develop or continue to have heartburn as a result of your pregnancy.  You may develop constipation because certain hormones are causing the muscles that push waste through your intestines to slow down.  You may develop hemorrhoids or swollen, bulging veins (varicose veins).  You may have back pain because of the weight gain and pregnancy hormones relaxing your joints between the bones in your pelvis and as a result of a shift in weight and the muscles that support your balance.  Your breasts will continue to grow and be tender.  Your gums may bleed and may be sensitive to brushing and flossing.  Dark spots or blotches (chloasma, mask of pregnancy) may develop on your face. This will likely fade after the baby is born.  A dark line from your belly button to the pubic area (linea nigra) may appear. This will likely  fade after the baby is born.  You may have changes in your hair. These can include thickening of your hair, rapid growth, and changes in texture. Some women also have hair loss during or after pregnancy, or hair that feels dry or thin. Your hair will most likely return to normal after your baby is born. WHAT TO EXPECT AT YOUR PRENATAL VISITS During a routine prenatal visit:  You will be weighed to make sure you and the fetus are growing normally.  Your blood pressure will be taken.  Your abdomen will be measured to track your baby's growth.  The fetal heartbeat will be listened to.  Any test results from the previous visit will be discussed. Your health care provider may ask you:  How you are feeling.  If you are feeling the baby move.  If you have had any abnormal symptoms, such as leaking fluid, bleeding, severe headaches, or abdominal cramping.  If you are using any tobacco products, including cigarettes, chewing tobacco, and electronic cigarettes.  If you have any questions. Other tests that may be performed during your second trimester include:  Blood tests that check for:  Low iron levels (anemia).  Gestational diabetes (between 24 and 28 weeks).  Rh antibodies.  Urine tests to check for infections, diabetes, or protein in the urine.  An ultrasound to confirm the proper growth and development of the baby.  An amniocentesis to check for possible genetic problems.  Fetal screens for spina bifida   and Down syndrome.  HIV (human immunodeficiency virus) testing. Routine prenatal testing includes screening for HIV, unless you choose not to have this test. HOME CARE INSTRUCTIONS   Avoid all smoking, herbs, alcohol, and unprescribed drugs. These chemicals affect the formation and growth of the baby.  Do not use any tobacco products, including cigarettes, chewing tobacco, and electronic cigarettes. If you need help quitting, ask your health care provider. You may receive  counseling support and other resources to help you quit.  Follow your health care provider's instructions regarding medicine use. There are medicines that are either safe or unsafe to take during pregnancy.  Exercise only as directed by your health care provider. Experiencing uterine cramps is a good sign to stop exercising.  Continue to eat regular, healthy meals.  Wear a good support bra for breast tenderness.  Do not use hot tubs, steam rooms, or saunas.  Wear your seat belt at all times when driving.  Avoid raw meat, uncooked cheese, cat litter boxes, and soil used by cats. These carry germs that can cause birth defects in the baby.  Take your prenatal vitamins.  Take 1500-2000 mg of calcium daily starting at the 20th week of pregnancy until you deliver your baby.  Try taking a stool softener (if your health care provider approves) if you develop constipation. Eat more high-fiber foods, such as fresh vegetables or fruit and whole grains. Drink plenty of fluids to keep your urine clear or pale yellow.  Take warm sitz baths to soothe any pain or discomfort caused by hemorrhoids. Use hemorrhoid cream if your health care provider approves.  If you develop varicose veins, wear support hose. Elevate your feet for 15 minutes, 3-4 times a day. Limit salt in your diet.  Avoid heavy lifting, wear low heel shoes, and practice good posture.  Rest with your legs elevated if you have leg cramps or low back pain.  Visit your dentist if you have not gone yet during your pregnancy. Use a soft toothbrush to brush your teeth and be gentle when you floss.  A sexual relationship may be continued unless your health care provider directs you otherwise.  Continue to go to all your prenatal visits as directed by your health care provider. SEEK MEDICAL CARE IF:   You have dizziness.  You have mild pelvic cramps, pelvic pressure, or nagging pain in the abdominal area.  You have persistent nausea,  vomiting, or diarrhea.  You have a bad smelling vaginal discharge.  You have pain with urination. SEEK IMMEDIATE MEDICAL CARE IF:   You have a fever.  You are leaking fluid from your vagina.  You have spotting or bleeding from your vagina.  You have severe abdominal cramping or pain.  You have rapid weight gain or loss.  You have shortness of breath with chest pain.  You notice sudden or extreme swelling of your face, hands, ankles, feet, or legs.  You have not felt your baby move in over an hour.  You have severe headaches that do not go away with medicine.  You have vision changes.   This information is not intended to replace advice given to you by your health care provider. Make sure you discuss any questions you have with your health care provider.   Document Released: 10/11/2001 Document Revised: 11/07/2014 Document Reviewed: 12/18/2012 Elsevier Interactive Patient Education 2016 [place].   Pregnancy and Influenza Influenza, also called the flu, is an infection of the respiratory tract. If you are pregnant, you  are more likely to catch the flu. You are also more likely to have a more serious case of the flu. This is because pregnancy lowers your body's ability to fight off infections (it weakens your immune system). It also puts additional stress on your heart and lungs, which makes you more likely to have complications. Having a bad case of the flu, especially with a high fever, can be dangerous for your developing baby. It can cause you to go into early labor. HOW DO PEOPLE GET THE FLU? The flu is caused by the influenza virus. This virus is common every year in the fall and winter. It spreads when virus particles get passed from person to person. You can get the virus if you are near a sick person who is coughing or sneezing. You can also get the virus if you touch something that has the virus on it and then touch your face. HOW CAN I PROTECT MYSELF AGAINST THE  FLU?  Get a flu shot. The best way to prevent the flu is to get a flu shot before flu season starts. The flu shot is not dangerous for your developing baby. It may even help protect your baby from the flu for up to 6 months after birth. The flu shot is one type of flu vaccine. Another type is a nasal spray vaccine. Do not get the nasal spray vaccine. It is not approved for pregnancy.  Do not come in close contact with sick people.  Do not share food, drinks, or utensils with other people.  Wash your hands often. Use hand sanitizer when soap and water are not available. WHAT SHOULD I DO IF I HAVE FLU SYMPTOMS? If you have any flu symptoms, call your health care provider right away. Flu symptoms include:  Fever or chills.  Muscle aches.  Headache.  Sore throat.  Nasal congestion.  Cough.  Feeling tired.  Loss of appetite.  Vomiting.  Diarrhea. You may be able to take an antiviral medicine to keep the flu from becoming severe and to shorten how long it lasts. WHAT SHOULD I DO AT HOME IF I AM DIAGNOSED WITH THE FLU?  Do not take any medicine, including cold or flu medicine, unless directed by your health care provider.  If you take antiviral medicine, make sure you finish it even if you start to feel better.  Drink enough fluid to keep your urine clear or pale yellow.  Get plenty of rest. [place] CARE IF I HAVE THE FLU?  You have trouble breathing.  You have chest pain.  You begin to have labor pains.  You have a high fever that does not go down after you take medicine.  You do not feel your baby move.  You have diarrhea or vomiting that will not go away.   This information is not intended to replace advice given to you by your health care provider. Make sure you discuss any questions you have with your health care provider.   Document Released: 08/19/2008 Document Revised: 10/22/2013 Document Reviewed: 09/13/2013 Elsevier Interactive  Patient Education [employer].

## 2016-08-03 ENCOUNTER — Telehealth: Payer: Self-pay | Admitting: *Deleted

## 2016-08-03 DIAGNOSIS — R519 Headache, unspecified: Secondary | ICD-10-CM

## 2016-08-03 DIAGNOSIS — R51 Headache: Principal | ICD-10-CM

## 2016-08-03 NOTE — Telephone Encounter (Signed)
10 tablets, needs to have appt with Allie Dimmer for headaches

## 2016-08-03 NOTE — Telephone Encounter (Signed)
I called Paula Sandoval to let her know we got her request for more fiorinal and that Dr. Roselie Awkward approved for 10 tabs but he wants her to see our headache specialist and someone will call her with appt. She is in agreement with plan. Refill phoned in.

## 2016-08-08 MED ORDER — BUTALBITAL-ASPIRIN-CAFFEINE 50-325-40 MG PO TABS: 1.0000 | ORAL_TABLET | Freq: Four times a day (QID) | ORAL | 0 refills | Status: DC | PRN

## 2016-08-24 ENCOUNTER — Ambulatory Visit (INDEPENDENT_AMBULATORY_CARE_PROVIDER_SITE_OTHER): Payer: BLUE CROSS/BLUE SHIELD | Admitting: Advanced Practice Midwife

## 2016-08-24 VITALS — BP 115/76 | HR 111 | Wt 333.7 lb

## 2016-08-24 DIAGNOSIS — IMO0002 Reserved for concepts with insufficient information to code with codable children: Secondary | ICD-10-CM

## 2016-08-24 DIAGNOSIS — O34219 Maternal care for unspecified type scar from previous cesarean delivery: Secondary | ICD-10-CM

## 2016-08-24 DIAGNOSIS — Z3A23 23 weeks gestation of pregnancy: Secondary | ICD-10-CM

## 2016-08-24 DIAGNOSIS — Z0489 Encounter for examination and observation for other specified reasons: Secondary | ICD-10-CM

## 2016-08-24 DIAGNOSIS — Z3483 Encounter for supervision of other normal pregnancy, third trimester: Secondary | ICD-10-CM

## 2016-08-24 NOTE — Progress Notes (Signed)
   PRENATAL VISIT NOTE  Subjective:  Paula Sandoval is a 25 y.o. G2P1001 at [redacted]w[redacted]d being seen today for ongoing prenatal care.  She is currently monitored for the following issues for this low-risk pregnancy and has Supervision of low-risk pregnancy; Morbid obesity with BMI of 45.0-49.9, adult [facility]); and Previous cesarean delivery, antepartum condition or complication on her problem list.  Patient reports no complaints.  Contractions: Not present. Vag. Bleeding: None.  Movement: Present. Denies leaking of fluid.   The following portions of the patient's history were reviewed and updated as appropriate: allergies, current medications, past family history, past medical history, past social history, past surgical history and problem list. Problem list updated.  Objective:   Vitals:   08/24/16 0847  BP: 115/76  Pulse: (!) 111  Weight: (!) 333 lb 11.2 oz (151.4 kg)    Fetal Status: Fetal Heart Rate (bpm): 144 Fundal Height: 25 cm Movement: Present     General:  Alert, oriented and cooperative. Patient is in no acute distress.  Skin: Skin is warm and dry. No rash noted.   Cardiovascular: Normal heart rate noted  Respiratory: Normal respiratory effort, no problems with respiration noted  Abdomen: Soft, gravid, appropriate for gestational age. Pain/Pressure: Present     Pelvic:  Cervical exam deferred        Extremities: Normal range of motion.  Edema: Trace  Mental Status: Normal mood and affect. Normal behavior. Normal judgment and thought content.   Assessment and Plan:  Pregnancy: G2P1001 at [redacted]w[redacted]d  1. Previous cesarean delivery affecting pregnancy, antepartum --Desires TOLAC, consent form given, reviewed risks/benefits. Pt to sign consent at next visit.  2. [redacted] weeks gestation of pregnancy  - [place] MFM OB FOLLOW UP; Future  3. Evaluate anatomy not seen on prior sonogram  - [place] MFM OB FOLLOW UP; Future  Preterm labor symptoms and general obstetric precautions including but not  limited to vaginal bleeding, contractions, leaking of fluid and fetal movement were reviewed in detail with the patient. Please refer to After Visit Summary for other counseling recommendations.  Return in about 4 weeks (around 09/21/2016).  Elvera Maria, CNM

## 2016-08-24 NOTE — Patient Instructions (Signed)

## 2016-08-24 NOTE — Progress Notes (Signed)
Educated pt on Skin to Skin  OB f/u US scheduled on October 30th @ 0845.  Pt notified.

## 2016-08-29 ENCOUNTER — Ambulatory Visit (HOSPITAL_COMMUNITY)
Admission: RE | Admit: 2016-08-29 | Discharge: 2016-08-29 | Disposition: A | Discharge status: Home / Self Care | Payer: BLUE CROSS/BLUE SHIELD | Source: Ambulatory Visit | Attending: Advanced Practice Midwife | Admitting: Advanced Practice Midwife

## 2016-08-29 DIAGNOSIS — Z362 Encounter for other antenatal screening follow-up: Secondary | ICD-10-CM | POA: Insufficient documentation

## 2016-08-29 DIAGNOSIS — Z3A23 23 weeks gestation of pregnancy: Secondary | ICD-10-CM | POA: Insufficient documentation

## 2016-08-29 DIAGNOSIS — O34219 Maternal care for unspecified type scar from previous cesarean delivery: Secondary | ICD-10-CM | POA: Diagnosis not present

## 2016-08-29 DIAGNOSIS — IMO0002 Reserved for concepts with insufficient information to code with codable children: Secondary | ICD-10-CM

## 2016-08-29 DIAGNOSIS — Z0489 Encounter for examination and observation for other specified reasons: Secondary | ICD-10-CM

## 2016-08-29 DIAGNOSIS — O99212 Obesity complicating pregnancy, second trimester: Secondary | ICD-10-CM | POA: Diagnosis not present

## 2016-09-05 ENCOUNTER — Encounter: Payer: Self-pay | Admitting: Obstetrics and Gynecology

## 2016-09-20 ENCOUNTER — Institutional Professional Consult (permissible substitution): Payer: BLUE CROSS/BLUE SHIELD | Admitting: Physician Assistant

## 2016-09-21 ENCOUNTER — Encounter: Payer: BLUE CROSS/BLUE SHIELD | Admitting: Obstetrics and Gynecology

## 2016-09-29 ENCOUNTER — Encounter: Payer: Self-pay | Admitting: *Deleted

## 2016-09-29 ENCOUNTER — Ambulatory Visit (INDEPENDENT_AMBULATORY_CARE_PROVIDER_SITE_OTHER): Payer: Medicaid Other | Admitting: Obstetrics and Gynecology

## 2016-09-29 VITALS — BP 130/57 | HR 115

## 2016-09-29 DIAGNOSIS — Z3493 Encounter for supervision of normal pregnancy, unspecified, third trimester: Secondary | ICD-10-CM

## 2016-09-29 DIAGNOSIS — O99213 Obesity complicating pregnancy, third trimester: Secondary | ICD-10-CM

## 2016-09-29 DIAGNOSIS — Z6841 Body Mass Index (BMI) 40.0 and over, adult: Secondary | ICD-10-CM

## 2016-09-29 DIAGNOSIS — O34219 Maternal care for unspecified type scar from previous cesarean delivery: Secondary | ICD-10-CM

## 2016-09-29 NOTE — Progress Notes (Signed)
Declined tdap

## 2016-09-29 NOTE — Progress Notes (Signed)
Subjective:  Paula Sandoval is a 25 y.o. G2P1001 at [redacted]w[redacted]d being seen today for ongoing prenatal care.  She is currently monitored for the following issues for this low-risk pregnancy and has Supervision of low-risk pregnancy; Morbid obesity with BMI of 45.0-49.9, adult [facility]); and Previous cesarean delivery, antepartum condition or complication on her problem list.  Patient reports no complaints.  Contractions: Not present.  .  Movement: Present. Denies leaking of fluid.   The following portions of the patient's history were reviewed and updated as appropriate: allergies, current medications, past family history, past medical history, past social history, past surgical history and problem list. Problem list updated.  Objective:   Vitals:   09/29/16 1256  BP: (!) 130/57  Pulse: (!) 115    Fetal Status: Fetal Heart Rate (bpm): 154   Movement: Present     General:  Alert, oriented and cooperative. Patient is in no acute distress.  Skin: Skin is warm and dry. No rash noted.   Cardiovascular: Normal heart rate noted  Respiratory: Normal respiratory effort, no problems with respiration noted  Abdomen: Soft, gravid, appropriate for gestational age. Pain/Pressure: Present     Pelvic:  Cervical exam deferred        Extremities: Normal range of motion.     Mental Status: Normal mood and affect. Normal behavior. Normal judgment and thought content.   Urinalysis:      Assessment and Plan:  Pregnancy: G2P1001 at [redacted]w[redacted]d  1. Encounter for supervision of low-risk pregnancy in third trimester Glucola next week Decline Tdap  2. Morbid obesity with BMI of 45.0-49.9, adult ([place])   3. Previous cesarean delivery, antepartum condition or complication TOLAC consent signed today  Preterm labor symptoms and general obstetric precautions including but not limited to vaginal bleeding, contractions, leaking of fluid and fetal movement were reviewed in detail with the patient. Please refer to After Visit  Summary for other counseling recommendations.  No Follow-up on file.   Chancy Milroy, MD

## 2016-10-06 ENCOUNTER — Other Ambulatory Visit: Payer: Medicaid Other

## 2016-10-06 DIAGNOSIS — Z3490 Encounter for supervision of normal pregnancy, unspecified, unspecified trimester: Secondary | ICD-10-CM

## 2016-10-06 LAB — CBC
HCT: 32.1 % — ABNORMAL LOW (ref 35.0–45.0)
HEMOGLOBIN: 10 g/dL — AB (ref 11.7–15.5)
MCH: 24.8 pg — AB (ref 27.0–33.0)
MCHC: 31.2 g/dL — AB (ref 32.0–36.0)
MCV: 79.7 fL — AB (ref 80.0–100.0)
MPV: 10.2 fL (ref 7.5–12.5)
Platelets: 355 10*3/uL (ref 140–400)
RBC: 4.03 MIL/uL (ref 3.80–5.10)
RDW: 15.6 % — ABNORMAL HIGH (ref 11.0–15.0)
WBC: 11.4 10*3/uL — ABNORMAL HIGH (ref 3.8–10.8)

## 2016-10-07 LAB — HIV ANTIBODY (ROUTINE TESTING W REFLEX): HIV: NONREACTIVE

## 2016-10-07 LAB — 2HR GTT W 1 HR, CARPENTER, 75 G
GLUCOSE, 1 HR, GEST: 127 mg/dL (ref <180)
GLUCOSE, FASTING, GEST: 77 mg/dL (ref 65–91)
Glucose, 2 Hr, Gest: 105 mg/dL (ref <153)

## 2016-10-07 LAB — RPR

## 2016-10-13 ENCOUNTER — Ambulatory Visit (INDEPENDENT_AMBULATORY_CARE_PROVIDER_SITE_OTHER): Payer: Medicaid Other | Admitting: Family Medicine

## 2016-10-13 VITALS — BP 110/70 | HR 106 | Wt 331.0 lb

## 2016-10-13 DIAGNOSIS — O34219 Maternal care for unspecified type scar from previous cesarean delivery: Secondary | ICD-10-CM

## 2016-10-13 DIAGNOSIS — Z3493 Encounter for supervision of normal pregnancy, unspecified, third trimester: Secondary | ICD-10-CM

## 2016-10-13 NOTE — Progress Notes (Signed)
   PRENATAL VISIT NOTE  Subjective:  Paula Sandoval is a 25 y.o. G2P1001 at [redacted]w[redacted]d being seen today for ongoing prenatal care.  She is currently monitored for the following issues for this low-risk pregnancy and has Supervision of low-risk pregnancy; Morbid obesity with BMI of 45.0-49.9, adult [facility]); and Previous cesarean delivery, antepartum condition or complication on her problem list.  Patient reports heartburn.  Contractions: Not present. Vag. Bleeding: None.  Movement: Present. Denies leaking of fluid.   The following portions of the patient's history were reviewed and updated as appropriate: allergies, current medications, past family history, past medical history, past social history, past surgical history and problem list. Problem list updated.  Objective:   Vitals:   10/13/16 0821  BP: 110/70  Pulse: (!) 106  Weight: (!) 331 lb (150.1 kg)    Fetal Status: Fetal Heart Rate (bpm): 140   Movement: Present     General:  Alert, oriented and cooperative. Patient is in no acute distress.  Skin: Skin is warm and dry. No rash noted.   Cardiovascular: Normal heart rate noted  Respiratory: Normal respiratory effort, no problems with respiration noted  Abdomen: Soft, gravid, appropriate for gestational age. Pain/Pressure: Present     Pelvic:  Cervical exam deferred        Extremities: Normal range of motion.  Edema: None  Mental Status: Normal mood and affect. Normal behavior. Normal judgment and thought content.   Assessment and Plan:  Pregnancy: G2P1001 at [redacted]w[redacted]d  1. Encounter for supervision of low-risk pregnancy in third trimester FHT and FH normal  2. Previous cesarean delivery, antepartum condition or complication   Preterm labor symptoms and general obstetric precautions including but not limited to vaginal bleeding, contractions, leaking of fluid and fetal movement were reviewed in detail with the patient. Please refer to After Visit Summary for other counseling  recommendations.  Return in about 2 weeks (around 10/27/2016).   Truett Mainland, DO

## 2016-10-13 NOTE — Patient Instructions (Signed)

## 2016-10-31 NOTE — L&D Delivery Note (Signed)
Vaginal Delivery Note  26 y.o. G2P1001 at [redacted]w[redacted]d delivered a viable female infant at [id number] in cephalic, ROA position. No nuchal cord. Right anterior shoulder delivered with ease. Sixty sec delayed cord clamping. Cord clamped x2 and cut. Placenta delivered spontaneously intact, with 3VC. Fundus firm on exam with massage and pitocin.  Mother: Anesthesia: Epidural Laceration: 2nd degree perineal x1 Suture repair: x1 Vicryl EBL: 150 mL  Baby: Apgars: 9, 9 Weight: Pending Cord pH: Not sent  Good hemostasis noted. Mom to postpartum.  Baby to Couplet care / Skin to Skin.  Paula Bing, DO, PGY-1 12/28/2016, 12:13 PM  OB FELLOW DELIVERY ATTESTATION  I was gloved and present for the delivery in its entirety, and I agree with the above resident's note.    Jacquiline Doe, MD 2:39 PM

## 2016-11-01 ENCOUNTER — Ambulatory Visit (INDEPENDENT_AMBULATORY_CARE_PROVIDER_SITE_OTHER): Payer: Medicaid Other | Admitting: Advanced Practice Midwife

## 2016-11-01 VITALS — BP 105/57 | HR 84 | Wt 327.0 lb

## 2016-11-01 DIAGNOSIS — O34219 Maternal care for unspecified type scar from previous cesarean delivery: Secondary | ICD-10-CM

## 2016-11-01 NOTE — Patient Instructions (Signed)
Third Trimester of Pregnancy The third trimester is from week 29 through week 40 (months 7 through 9). The third trimester is a time when the unborn baby (fetus) is growing rapidly. At the end of the ninth month, the fetus is about 20 inches in length and weighs 6-10 pounds. Body changes during your third trimester Your body goes through many changes during pregnancy. The changes vary from woman to woman. During the third trimester:  Your weight will continue to increase. You can expect to gain 25-35 pounds (11-16 kg) by the end of the pregnancy.  You may begin to get stretch marks on your hips, abdomen, and breasts.  You may urinate more often because the fetus is moving lower into your pelvis and pressing on your bladder.  You may develop or continue to have heartburn. This is caused by increased hormones that slow down muscles in the digestive tract.  You may develop or continue to have constipation because increased hormones slow digestion and cause the muscles that push waste through your intestines to relax.  You may develop hemorrhoids. These are swollen veins (varicose veins) in the rectum that can itch or be painful.  You may develop swollen, bulging veins (varicose veins) in your legs.  You may have increased body aches in the pelvis, back, or thighs. This is due to weight gain and increased hormones that are relaxing your joints.  You may have changes in your hair. These can include thickening of your hair, rapid growth, and changes in texture. Some women also have hair loss during or after pregnancy, or hair that feels dry or thin. Your hair will most likely return to normal after your baby is born.  Your breasts will continue to grow and they will continue to become tender. A yellow fluid (colostrum) may leak from your breasts. This is the first milk you are producing for your baby.  Your belly button may stick out.  You may notice more swelling in your hands, face, or  ankles.  You may have increased tingling or numbness in your hands, arms, and legs. The skin on your belly may also feel numb.  You may feel short of breath because of your expanding uterus.  You may have more problems sleeping. This can be caused by the size of your belly, increased need to urinate, and an increase in your body's metabolism.  You may notice the fetus "dropping," or moving lower in your abdomen.  You may have increased vaginal discharge.  Your cervix becomes thin and soft (effaced) near your due date. What to expect at prenatal visits You will have prenatal exams every 2 weeks until week 36. Then you will have weekly prenatal exams. During a routine prenatal visit:  You will be weighed to make sure you and the fetus are growing normally.  Your blood pressure will be taken.  Your abdomen will be measured to track your baby's growth.  The fetal heartbeat will be listened to.  Any test results from the previous visit will be discussed.  You may have a cervical check near your due date to see if you have effaced. At around 36 weeks, your health care provider will check your cervix. At the same time, your health care provider will also perform a test on the secretions of the vaginal tissue. This test is to determine if a type of bacteria, Group B streptococcus, is present. Your health care provider will explain this further. Your health care provider may ask you:    What your birth plan is.  How you are feeling.  If you are feeling the baby move.  If you have had any abnormal symptoms, such as leaking fluid, bleeding, severe headaches, or abdominal cramping.  If you are using any tobacco products, including cigarettes, chewing tobacco, and electronic cigarettes.  If you have any questions. Other tests or screenings that may be performed during your third trimester include:  Blood tests that check for low iron levels (anemia).  Fetal testing to check the health,  activity level, and growth of the fetus. Testing is done if you have certain medical conditions or if there are problems during the pregnancy.  Nonstress test (NST). This test checks the health of your baby to make sure there are no signs of problems, such as the baby not getting enough oxygen. During this test, a belt is placed around your belly. The baby is made to move, and its heart rate is monitored during movement. What is false labor? False labor is a condition in which you feel small, irregular tightenings of the muscles in the womb (contractions) that eventually go away. These are called Braxton Hicks contractions. Contractions may last for hours, days, or even weeks before true labor sets in. If contractions come at regular intervals, become more frequent, increase in intensity, or become painful, you should see your health care provider. What are the signs of labor?  Abdominal cramps.  Regular contractions that start at 10 minutes apart and become stronger and more frequent with time.  Contractions that start on the top of the uterus and spread down to the lower abdomen and back.  Increased pelvic pressure and dull back pain.  A watery or bloody mucus discharge that comes from the vagina.  Leaking of amniotic fluid. This is also known as your "water breaking." It could be a slow trickle or a gush. Let your doctor know if it has a color or strange odor. If you have any of these signs, call your health care provider right away, even if it is before your due date. Follow these instructions at home: Eating and drinking  Continue to eat regular, healthy meals.  Do not eat:  Raw meat or meat spreads.  Unpasteurized milk or cheese.  Unpasteurized juice.  Store-made salad.  Refrigerated smoked seafood.  Hot dogs or deli meat, unless they are piping hot.  More than 6 ounces of albacore tuna a week.  Shark, swordfish, king mackerel, or tile fish.  Store-made salads.  Raw  sprouts, such as mung bean or alfalfa sprouts.  Take prenatal vitamins as told by your health care provider.  Take 1000 mg of calcium daily as told by your health care provider.  If you develop constipation:  Take over-the-counter or prescription medicines.  Drink enough fluid to keep your urine clear or pale yellow.  Eat foods that are high in fiber, such as fresh fruits and vegetables, whole grains, and beans.  Limit foods that are high in fat and processed sugars, such as fried and sweet foods. Activity  Exercise only as directed by your health care provider. Healthy pregnant women should aim for 2 hours and 30 minutes of moderate exercise per week. If you experience any pain or discomfort while exercising, stop.  Avoid heavy lifting.  Do not exercise in extreme heat or humidity, or at high altitudes.  Wear low-heel, comfortable shoes.  Practice good posture.  Do not travel far distances unless it is absolutely necessary and only with the approval   of your health care provider.  Wear your seat belt at all times while in a car, on a bus, or on a plane.  Take frequent breaks and rest with your legs elevated if you have leg cramps or low back pain.  Do not use hot tubs, steam rooms, or saunas.  You may continue to have sex unless your health care provider tells you otherwise. Lifestyle  Do not use any products that contain nicotine or tobacco, such as cigarettes and e-cigarettes. If you need help quitting, ask your health care provider.  Do not drink alcohol.  Do not use any medicinal herbs or unprescribed drugs. These chemicals affect the formation and growth of the baby.  If you develop varicose veins:  Wear support pantyhose or compression stockings as told by your healthcare provider.  Elevate your feet for 15 minutes, 3-4 times a day.  Wear a supportive maternity bra to help with breast tenderness. General instructions  Take over-the-counter and prescription  medicines only as told by your health care provider. There are medicines that are either safe or unsafe to take during pregnancy.  Take warm sitz baths to soothe any pain or discomfort caused by hemorrhoids. Use hemorrhoid cream or witch hazel if your health care provider approves.  Avoid cat litter boxes and soil used by cats. These carry germs that can cause birth defects in the baby. If you have a cat, ask someone to clean the litter box for you.  To prepare for the arrival of your baby:  Take prenatal classes to understand, practice, and ask questions about the labor and delivery.  Make a trial run to the hospital.  Visit the hospital and tour the maternity area.  Arrange for maternity or paternity leave through employers.  Arrange for family and friends to take care of pets while you are in the hospital.  Purchase a rear-facing car seat and make sure you know how to install it in your car.  Pack your hospital bag.  Prepare the baby's nursery. Make sure to remove all pillows and stuffed animals from the baby's crib to prevent suffocation.  Visit your dentist if you have not gone during your pregnancy. Use a soft toothbrush to brush your teeth and be gentle when you floss.  Keep all prenatal follow-up visits as told by your health care provider. This is important. Contact a health care provider if:  You are unsure if you are in labor or if your water has broken.  You become dizzy.  You have mild pelvic cramps, pelvic pressure, or nagging pain in your abdominal area.  You have lower back pain.  You have persistent nausea, vomiting, or diarrhea.  You have an unusual or bad smelling vaginal discharge.  You have pain when you urinate. Get help right away if:  You have a fever.  You are leaking fluid from your vagina.  You have spotting or bleeding from your vagina.  You have severe abdominal pain or cramping.  You have rapid weight loss or weight gain.  You have  shortness of breath with chest pain.  You notice sudden or extreme swelling of your face, hands, ankles, feet, or legs.  Your baby makes fewer than 10 movements in 2 hours.  You have severe headaches that do not go away with medicine.  You have vision changes. Summary  The third trimester is from week 29 through week 40, months 7 through 9. The third trimester is a time when the unborn baby (fetus)   is growing rapidly.  During the third trimester, your discomfort may increase as you and your baby continue to gain weight. You may have abdominal, leg, and back pain, sleeping problems, and an increased need to urinate.  During the third trimester your breasts will keep growing and they will continue to become tender. A yellow fluid (colostrum) may leak from your breasts. This is the first milk you are producing for your baby.  False labor is a condition in which you feel small, irregular tightenings of the muscles in the womb (contractions) that eventually go away. These are called Braxton Hicks contractions. Contractions may last for hours, days, or even weeks before true labor sets in.  Signs of labor can include: abdominal cramps; regular contractions that start at 10 minutes apart and become stronger and more frequent with time; watery or bloody mucus discharge that comes from the vagina; increased pelvic pressure and dull back pain; and leaking of amniotic fluid. This information is not intended to replace advice given to you by your health care provider. Make sure you discuss any questions you have with your health care provider. Document Released: 10/11/2001 Document Revised: 03/24/2016 Document Reviewed: 12/18/2012 Elsevier Interactive Patient Education  2017 [employer]  

## 2016-11-01 NOTE — Progress Notes (Signed)
   PRENATAL VISIT NOTE  Subjective:  Paula Sandoval is a 26 y.o. G2P1001 at [redacted]w[redacted]d being seen today for ongoing prenatal care.  She is currently monitored for the following issues for this low-risk pregnancy and has Supervision of low-risk pregnancy; Morbid obesity with BMI of 45.0-49.9, adult [facility]); and Previous cesarean delivery, antepartum condition or complication on her problem list.  Patient reports no complaints.  Contractions: Irritability. Vag. Bleeding: None.  Movement: Present. Denies leaking of fluid.   The following portions of the patient's history were reviewed and updated as appropriate: allergies, current medications, past family history, past medical history, past social history, past surgical history and problem list. Problem list updated.  Objective:   Vitals:   11/01/16 0758  BP: (!) 105/57  Pulse: 84  Weight: (!) 327 lb (148.3 kg)    Fetal Status: Fetal Heart Rate (bpm): 145 Fundal Height: 33 cm Movement: Present     General:  Alert, oriented and cooperative. Patient is in no acute distress.  Skin: Skin is warm and dry. No rash noted.   Cardiovascular: Normal heart rate noted  Respiratory: Normal respiratory effort, no problems with respiration noted  Abdomen: Soft, gravid, appropriate for gestational age. Pain/Pressure: Absent     Pelvic:  Cervical exam deferred        Extremities: Normal range of motion.  Edema: Trace  Mental Status: Normal mood and affect. Normal behavior. Normal judgment and thought content.   Assessment and Plan:  Pregnancy: G2P1001 at [redacted]w[redacted]d  1. Previous cesarean delivery, antepartum condition or complication --Desires VBAC.  Consent in chart. Previous C/S for NR NST after foley bulb induction of labor.  Preterm labor symptoms and general obstetric precautions including but not limited to vaginal bleeding, contractions, leaking of fluid and fetal movement were reviewed in detail with the patient. Please refer to After Visit Summary for  other counseling recommendations.  Return in about 2 weeks (around 11/15/2016).   Elvera Maria, CNM

## 2016-11-10 ENCOUNTER — Inpatient Hospital Stay (HOSPITAL_COMMUNITY)
Admission: AD | Admit: 2016-11-10 | Discharge: 2016-11-10 | Disposition: A | Discharge status: Home / Self Care | Payer: Medicaid Other | Source: Ambulatory Visit | Attending: Obstetrics and Gynecology | Admitting: Obstetrics and Gynecology

## 2016-11-10 ENCOUNTER — Encounter (HOSPITAL_COMMUNITY): Payer: Self-pay | Admitting: Anesthesiology

## 2016-11-10 ENCOUNTER — Encounter (HOSPITAL_COMMUNITY): Payer: Self-pay

## 2016-11-10 DIAGNOSIS — D649 Anemia, unspecified: Secondary | ICD-10-CM | POA: Diagnosis not present

## 2016-11-10 DIAGNOSIS — O99613 Diseases of the digestive system complicating pregnancy, third trimester: Secondary | ICD-10-CM

## 2016-11-10 DIAGNOSIS — Z87891 Personal history of nicotine dependence: Secondary | ICD-10-CM | POA: Insufficient documentation

## 2016-11-10 DIAGNOSIS — Z79899 Other long term (current) drug therapy: Secondary | ICD-10-CM | POA: Diagnosis not present

## 2016-11-10 DIAGNOSIS — Z3689 Encounter for other specified antenatal screening: Secondary | ICD-10-CM

## 2016-11-10 DIAGNOSIS — O99213 Obesity complicating pregnancy, third trimester: Secondary | ICD-10-CM | POA: Diagnosis not present

## 2016-11-10 DIAGNOSIS — E669 Obesity, unspecified: Secondary | ICD-10-CM | POA: Diagnosis not present

## 2016-11-10 DIAGNOSIS — O99013 Anemia complicating pregnancy, third trimester: Secondary | ICD-10-CM | POA: Diagnosis not present

## 2016-11-10 DIAGNOSIS — Z3A34 34 weeks gestation of pregnancy: Secondary | ICD-10-CM

## 2016-11-10 DIAGNOSIS — A084 Viral intestinal infection, unspecified: Secondary | ICD-10-CM

## 2016-11-10 DIAGNOSIS — O99513 Diseases of the respiratory system complicating pregnancy, third trimester: Secondary | ICD-10-CM | POA: Insufficient documentation

## 2016-11-10 DIAGNOSIS — E860 Dehydration: Secondary | ICD-10-CM

## 2016-11-10 DIAGNOSIS — O99283 Endocrine, nutritional and metabolic diseases complicating pregnancy, third trimester: Secondary | ICD-10-CM | POA: Insufficient documentation

## 2016-11-10 DIAGNOSIS — Z9889 Other specified postprocedural states: Secondary | ICD-10-CM | POA: Diagnosis not present

## 2016-11-10 LAB — COMPREHENSIVE METABOLIC PANEL
ALBUMIN: 3 g/dL — AB (ref 3.5–5.0)
ALK PHOS: 77 U/L (ref 38–126)
ALT: 19 U/L (ref 14–54)
AST: 21 U/L (ref 15–41)
Anion gap: 8 (ref 5–15)
BILIRUBIN TOTAL: 0.7 mg/dL (ref 0.3–1.2)
BUN: 7 mg/dL (ref 6–20)
CALCIUM: 8.4 mg/dL — AB (ref 8.9–10.3)
CO2: 23 mmol/L (ref 22–32)
CREATININE: 0.74 mg/dL (ref 0.44–1.00)
Chloride: 104 mmol/L (ref 101–111)
GFR calc Af Amer: >60 mL/min (ref >60)
GFR calc non Af Amer: >60 mL/min (ref >60)
GLUCOSE: 93 mg/dL (ref 65–99)
Potassium: 4 mmol/L (ref 3.5–5.1)
Sodium: 135 mmol/L (ref 135–145)
TOTAL PROTEIN: 6.6 g/dL (ref 6.5–8.1)

## 2016-11-10 LAB — URINALYSIS, ROUTINE W REFLEX MICROSCOPIC
GLUCOSE, UA: NEGATIVE mg/dL
Hgb urine dipstick: NEGATIVE
Ketones, ur: 5 mg/dL — AB
Nitrite: NEGATIVE
PROTEIN: 100 mg/dL — AB
Specific Gravity, Urine: 1.032 — ABNORMAL HIGH (ref 1.005–1.030)
pH: 5 (ref 5.0–8.0)

## 2016-11-10 LAB — CBC
HEMATOCRIT: 32.3 % — AB (ref 36.0–46.0)
HEMOGLOBIN: 10.6 g/dL — AB (ref 12.0–15.0)
MCH: 25.1 pg — ABNORMAL LOW (ref 26.0–34.0)
MCHC: 32.8 g/dL (ref 30.0–36.0)
MCV: 76.4 fL — ABNORMAL LOW (ref 78.0–100.0)
Platelets: 320 10*3/uL (ref 150–400)
RBC: 4.23 MIL/uL (ref 3.87–5.11)
RDW: 15.8 % — ABNORMAL HIGH (ref 11.5–15.5)
WBC: 8.7 10*3/uL (ref 4.0–10.5)

## 2016-11-10 MED ORDER — SODIUM CHLORIDE 0.9 % IV SOLN
8.0000 mg | Freq: Once | INTRAVENOUS | Status: AC
  Administered 2016-11-10: 8 mg via INTRAVENOUS
  Filled 2016-11-10: qty 4

## 2016-11-10 MED ORDER — ONDANSETRON 4 MG PO TBDP: 4.0000 mg | ORAL_TABLET | Freq: Three times a day (TID) | ORAL | 0 refills | Status: DC | PRN

## 2016-11-10 MED ORDER — LACTATED RINGERS IV BOLUS (SEPSIS)
1000.0000 mL | Freq: Once | INTRAVENOUS | Status: AC
  Administered 2016-11-10: 1000 mL via INTRAVENOUS

## 2016-11-10 NOTE — Discharge Instructions (Signed)
Viral Gastroenteritis, Adult Introduction Viral gastroenteritis is also known as the stomach flu. This condition is caused by certain germs (viruses). These germs can be passed from person to person very easily (are very contagious). This condition can cause sudden watery poop (diarrhea), fever, and throwing up (vomiting). Having watery poop and throwing up can make you feel weak and cause you to get dehydrated. Dehydration can make you tired and thirsty, make you have a dry mouth, and make it so you pee (urinate) less often. Older adults and people with other diseases or a weak defense system (immune system) are at higher risk for dehydration. It is important to replace the fluids that you lose from having watery poop and throwing up. Follow these instructions at home: Follow instructions from your doctor about how to care for yourself at home. Eating and drinking Follow these instructions as told by your doctor:  Take an oral rehydration solution (ORS). This is a drink that is sold at pharmacies and stores.  Drink clear fluids in small amounts as you are able, such as:  Water.  Ice chips.  Diluted fruit juice.  Low-calorie sports drinks.  Eat bland, easy-to-digest foods in small amounts as you are able, such as:  Bananas.  Applesauce.  Rice.  Low-fat (lean) meats.  Toast.  Crackers.  Avoid fluids that have a lot of sugar or caffeine in them.  Avoid alcohol.  Avoid spicy or fatty foods. General instructions  Drink enough fluid to keep your pee (urine) clear or pale yellow.  Wash your hands often. If you cannot use soap and water, use hand sanitizer.  Make sure that all people in your home wash their hands well and often.  Rest at home while you get better.  Take over-the-counter and prescription medicines only as told by your doctor.  Watch your condition for any changes.  Take a warm bath to help with any burning or pain from having watery poop.  Keep all  follow-up visits as told by your doctor. This is important. Contact a doctor if:  You cannot keep fluids down.  Your symptoms get worse.  You have new symptoms.  You feel light-headed or dizzy.  You have muscle cramps. Get help right away if:  You have chest pain.  You feel very weak or you pass out (faint).  You see blood in your throw-up.  Your throw-up looks like coffee grounds.  You have bloody or black poop (stools) or poop that look like tar.  You have a very bad headache, a stiff neck, or both.  You have a rash.  You have very bad pain, cramping, or bloating in your belly (abdomen).  You have trouble breathing.  You are breathing very quickly.  Your heart is beating very quickly.  Your skin feels cold and clammy.  You feel confused.  You have pain when you pee.  You have signs of dehydration, such as:  Dark pee, hardly any pee, or no pee.  Cracked lips.  Dry mouth.  Sunken eyes.  Sleepiness.  Weakness. This information is not intended to replace advice given to you by your health care provider. Make sure you discuss any questions you have with your health care provider. Document Released: 04/04/2008 Document Revised: 05/06/2016 Document Reviewed: 06/23/2015  2017 Elsevier  

## 2016-11-10 NOTE — MAU Note (Signed)
Attempted 2 IVs and missed. Asked another nurse to try.

## 2016-11-10 NOTE — MAU Note (Signed)
Called CRNA to look for IV access.

## 2016-11-10 NOTE — MAU Provider Note (Signed)
History     CSN: [id number]  Arrival date and time: 11/10/16 1749   First Provider Initiated Contact with Patient 11/10/16 1847      Chief Complaint  Patient presents with  . Emesis  . Abdominal Pain   G2P1001 @34 .2 here with N/V x2 days. She is unable to tolerate food or fluids today. She reports watery diarrhea that started around the same time and occurring 3-5 times per day. She's had 3 stools today. She denies sick contacts. She denies fever. She reports good FM. She denies VB and LOF. She feels irregular contractions early in the am and late at night x3 days. Review of records reveals pregnancy has been complicated by previous CS, obesity, and anemia.    OB History    Gravida Para Term Preterm AB Living   2 1 1     1    SAB TAB Ectopic Multiple Live Births           1      Past Medical History:  Diagnosis Date  . Asthma   . 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 CESAREAN SECTION  02/22/2012   Procedure: CESAREAN SECTION;  Surgeon: Frederico Hamman, MD;  Location: [facility];  Service: Gynecology;  Laterality: N/A;  . excision of tumor right leg  2009    Family History  Problem Relation Age of Onset  . Anesthesia problems Neg Hx   . Hypotension Neg Hx   . Malignant hyperthermia Neg Hx   . Pseudochol deficiency Neg Hx     Social History  Substance Use Topics  . Smoking status: Former Smoker    Types: Cigarettes    Quit date: 2008  . Smokeless tobacco: Never Used  . Alcohol use No    Allergies: No Known Allergies  Prescriptions Prior to Admission  Medication Sig Dispense Refill Last Dose  . albuterol (PROVENTIL HFA;VENTOLIN HFA) 108 (90 Base) MCG/ACT inhaler Inhale 2 puffs into the lungs every 4 (four) hours as needed for wheezing or shortness of breath. 1 Inhaler 0 Taking  . Prenatal Vit-Fe Fumarate-FA (PRENATAL MULTIVITAMIN) TABS tablet Take 1 tablet by mouth daily at 12 noon.   Taking    Review of  Systems  Constitutional: Negative for chills and fever.  Gastrointestinal: Positive for abdominal pain, diarrhea, nausea and vomiting.  Genitourinary: Negative for dysuria, urgency, vaginal bleeding and vaginal discharge.   Physical Exam   Blood pressure 110/67, pulse 120, temperature 98.7 F (37.1 C), temperature source Oral, resp. rate 18, weight (!) 148.4 kg (327 lb 4 oz), last menstrual period 03/15/2016.  Physical Exam  Nursing note and vitals reviewed. Constitutional: She is oriented to person, place, and time. She appears well-developed and well-nourished. No distress.  HENT:  Head: Normocephalic.  Neck: Normal range of motion.  Cardiovascular: Normal rate.   GI: Soft. She exhibits no distension. There is no tenderness.  gravid  Genitourinary:  Genitourinary Comments: SVE: closed/thick  Musculoskeletal: Normal range of motion.  Neurological: She is alert and oriented to person, place, and time.  Skin: Skin is warm and dry.  Psychiatric: She has a normal mood and affect.   EFM: 135 bpm, mod variability, + accels, no decels Toco: none  Results for orders placed or performed during the hospital encounter of 11/10/16 (from the past 24 hour(s))  Urinalysis, Routine w reflex microscopic     Status: Abnormal   Collection Time: 11/10/16  6:14  PM  Result Value Ref Range   Color, Urine AMBER (A) YELLOW   APPearance HAZY (A) CLEAR   Specific Gravity, Urine 1.032 (H) 1.005 - 1.030   pH 5.0 5.0 - 8.0   Glucose, UA NEGATIVE NEGATIVE mg/dL   Hgb urine dipstick NEGATIVE NEGATIVE   Bilirubin Urine SMALL (A) NEGATIVE   Ketones, ur 5 (A) NEGATIVE mg/dL   Protein, ur 100 (A) NEGATIVE mg/dL   Nitrite NEGATIVE NEGATIVE   Leukocytes, UA TRACE (A) NEGATIVE   RBC / HPF 0-5 0 - 5 RBC/hpf   WBC, UA 6-30 0 - 5 WBC/hpf   Bacteria, UA MANY (A) NONE SEEN   Squamous Epithelial / LPF 6-30 (A) NONE SEEN   Mucous PRESENT   CBC     Status: Abnormal   Collection Time: 11/10/16  7:08 PM  Result  Value Ref Range   WBC 8.7 4.0 - 10.5 K/uL   RBC 4.23 3.87 - 5.11 MIL/uL   Hemoglobin 10.6 (L) 12.0 - 15.0 g/dL   HCT 32.3 (L) 36.0 - 46.0 %   MCV 76.4 (L) 78.0 - 100.0 fL   MCH 25.1 (L) 26.0 - 34.0 pg   MCHC 32.8 30.0 - 36.0 g/dL   RDW 15.8 (H) 11.5 - 15.5 %   Platelets 320 150 - 400 K/uL  Comprehensive metabolic panel     Status: Abnormal   Collection Time: 11/10/16  7:08 PM  Result Value Ref Range   Sodium 135 135 - 145 mmol/L   Potassium 4.0 3.5 - 5.1 mmol/L   Chloride 104 101 - 111 mmol/L   CO2 23 22 - 32 mmol/L   Glucose, Bld 93 65 - 99 mg/dL   BUN 7 6 - 20 mg/dL   Creatinine, Ser 0.74 0.44 - 1.00 mg/dL   Calcium 8.4 (L) 8.9 - 10.3 mg/dL   Total Protein 6.6 6.5 - 8.1 g/dL   Albumin 3.0 (L) 3.5 - 5.0 g/dL   AST 21 15 - 41 U/L   ALT 19 14 - 54 U/L   Alkaline Phosphatase 77 38 - 126 U/L   Total Bilirubin 0.7 0.3 - 1.2 mg/dL   GFR calc non Af Amer >60 >60 mL/min   GFR calc Af Amer >60 >60 mL/min   Anion gap 8 5 - 15   MAU Course  Procedures LR 1 L bolus Zofran 8 mg IV  MDM Labs ordered and reviewed. No evidence of acute abdominal process or PTL. Tolerating po. No episodes of emesis. Stable for discharge home.   Assessment and Plan   1. [redacted] weeks gestation of pregnancy   2. NST (non-stress test) reactive   3. Viral gastroenteritis   4. Dehydration    Discharge home Rest Maintain hydration Rx Zofran Follow up as scheduled in office  Allergies as of 11/10/2016   No Known Allergies     Medication List    STOP taking these medications   albuterol 108 (90 Base) MCG/ACT inhaler Commonly known as:  PROVENTIL HFA;VENTOLIN HFA     TAKE these medications   ondansetron 4 MG disintegrating tablet Commonly known as:  ZOFRAN ODT Take 1 tablet (4 mg total) by mouth every 8 (eight) hours as needed for nausea or vomiting.   prenatal multivitamin Tabs tablet Take 1 tablet by mouth daily at 12 noon.      Julianne Handler, CNM 11/10/2016, 6:52 PM

## 2016-11-10 NOTE — MAU Note (Addendum)
having pressure in lower area, esp when she walks.  Has really ate anything, no appetite. Vomited what she did eat. Thought she had a gi bug earlier in the week, runny stools twice a day

## 2016-11-10 NOTE — MAU Note (Signed)
Patient sitting up from 1936-2000 for IV placement. Fetal monitors not tracing.

## 2016-11-15 ENCOUNTER — Other Ambulatory Visit (HOSPITAL_COMMUNITY)
Admission: RE | Admit: 2016-11-15 | Discharge: 2016-11-15 | Disposition: A | Discharge status: Home / Self Care | Payer: BLUE CROSS/BLUE SHIELD | Source: Ambulatory Visit | Attending: Advanced Practice Midwife | Admitting: Advanced Practice Midwife

## 2016-11-15 ENCOUNTER — Ambulatory Visit (INDEPENDENT_AMBULATORY_CARE_PROVIDER_SITE_OTHER): Payer: Medicaid Other | Admitting: Advanced Practice Midwife

## 2016-11-15 VITALS — BP 110/68 | HR 97 | Wt 331.3 lb

## 2016-11-15 DIAGNOSIS — Z113 Encounter for screening for infections with a predominantly sexual mode of transmission: Secondary | ICD-10-CM | POA: Insufficient documentation

## 2016-11-15 DIAGNOSIS — O34219 Maternal care for unspecified type scar from previous cesarean delivery: Secondary | ICD-10-CM

## 2016-11-15 DIAGNOSIS — Z3493 Encounter for supervision of normal pregnancy, unspecified, third trimester: Secondary | ICD-10-CM

## 2016-11-15 LAB — OB RESULTS CONSOLE GC/CHLAMYDIA: GC PROBE AMP, GENITAL: NEGATIVE

## 2016-11-15 LAB — OB RESULTS CONSOLE GBS: STREP GROUP B AG: POSITIVE

## 2016-11-15 NOTE — Patient Instructions (Signed)
Braxton Hicks Contractions °Contractions of the uterus can occur throughout pregnancy. Contractions are not always a sign that you are in labor.  °WHAT ARE BRAXTON HICKS CONTRACTIONS?  °Contractions that occur before labor are called Braxton Hicks contractions, or false labor. Toward the end of pregnancy (32-34 weeks), these contractions can develop more often and may become more forceful. This is not true labor because these contractions do not result in opening (dilatation) and thinning of the cervix. They are sometimes difficult to tell apart from true labor because these contractions can be forceful and people have different pain tolerances. You should not feel embarrassed if you go to the hospital with false labor. Sometimes, the only way to tell if you are in true labor is for your health care provider to look for changes in the cervix. °If there are no prenatal problems or other health problems associated with the pregnancy, it is completely safe to be sent home with false labor and await the onset of true labor. °HOW CAN YOU TELL THE DIFFERENCE BETWEEN TRUE AND FALSE LABOR? °False Labor  °· The contractions of false labor are usually shorter and not as hard as those of true labor.   °· The contractions are usually irregular.   °· The contractions are often felt in the front of the lower abdomen and in the groin.   °· The contractions may go away when you walk around or change positions while lying down.   °· The contractions get weaker and are shorter lasting as time goes on.   °· The contractions do not usually become progressively stronger, regular, and closer together as with true labor.   °True Labor  °· Contractions in true labor last 30-70 seconds, become very regular, usually become more intense, and increase in frequency.   °· The contractions do not go away with walking.   °· The discomfort is usually felt in the top of the uterus and spreads to the lower abdomen and low back.   °· True labor can be  determined by your health care provider with an exam. This will show that the cervix is dilating and getting thinner.   °WHAT TO REMEMBER °· Keep up with your usual exercises and follow other instructions given by your health care provider.   °· Take medicines as directed by your health care provider.   °· Keep your regular prenatal appointments.   °· Eat and drink lightly if you think you are going into labor.   °· If Braxton Hicks contractions are making you uncomfortable:   °¨ Change your position from lying down or resting to walking, or from walking to resting.   °¨ Sit and rest in a tub of warm water.   °¨ Drink 2-3 glasses of water. Dehydration may cause these contractions.   °¨ Do slow and deep breathing several times an hour.   °WHEN SHOULD I SEEK IMMEDIATE MEDICAL CARE? °Seek immediate medical care if: °· Your contractions become stronger, more regular, and closer together.   °· You have fluid leaking or gushing from your vagina.   °· You have a fever.   °· You pass blood-tinged mucus.   °· You have vaginal bleeding.   °· You have continuous abdominal pain.   °· You have low back pain that you never had before.   °· You feel your baby's head pushing down and causing pelvic pressure.   °· Your baby is not moving as much as it used to.   °This information is not intended to replace advice given to you by your health care provider. Make sure you discuss any questions you have with your health care   provider. °Document Released: 10/17/2005 Document Revised: 02/08/2016 Document Reviewed: 07/29/2013 °Elsevier Interactive Patient Education © 2017 Elsevier Inc. ° °

## 2016-11-15 NOTE — Progress Notes (Signed)
   PRENATAL VISIT NOTE  Subjective:  Paula Sandoval is a 26 y.o. G2P1001 at [redacted]w[redacted]d being seen today for ongoing prenatal care.  She is currently monitored for the following issues for this low-risk pregnancy and has Supervision of low-risk pregnancy; Morbid obesity with BMI of 45.0-49.9, adult [facility]); and Previous cesarean delivery, antepartum condition or complication on her problem list.  Patient reports occasional contractions.  Contractions: Not present. Vag. Bleeding: None.  Movement: Present. Denies leaking of fluid.   The following portions of the patient's history were reviewed and updated as appropriate: allergies, current medications, past family history, past medical history, past social history, past surgical history and problem list. Problem list updated.  Objective:   Vitals:   11/15/16 0818  BP: 110/68  Pulse: 97  Weight: (!) 331 lb 4.8 oz (150.3 kg)    Fetal Status: Fetal Heart Rate (bpm): 128 Fundal Height: 37 cm Movement: Present  Presentation: Vertex  General:  Alert, oriented and cooperative. Patient is in no acute distress.  Skin: Skin is warm and dry. No rash noted.   Cardiovascular: Normal heart rate noted  Respiratory: Normal respiratory effort, no problems with respiration noted  Abdomen: Soft, gravid, appropriate for gestational age. Pain/Pressure: Absent     Pelvic:  Cervical exam performed Dilation: Closed Effacement (%): 0 Station: Ballotable  Extremities: Normal range of motion.  Edema: None  Mental Status: Normal mood and affect. Normal behavior. Normal judgment and thought content.   Assessment and Plan:  Pregnancy: G2P1001 at [redacted]w[redacted]d  1. Encounter for supervision of normal pregnancy in third trimester, unspecified gravidity  - Culture, beta strep (group b only) - GC/Chlamydia probe amp ([place])not at [facility]  2. Encounter for supervision of low-risk pregnancy in third trimester   3. Previous cesarean delivery, antepartum condition or  complication   Preterm labor symptoms and general obstetric precautions including but not limited to vaginal bleeding, contractions, leaking of fluid and fetal movement were reviewed in detail with the patient. Please refer to After Visit Summary for other counseling recommendations.  Return in 1 week (on 11/22/2016).   Manya Silvas, CNM

## 2016-11-16 LAB — CULTURE, BETA STREP (GROUP B ONLY)

## 2016-11-17 ENCOUNTER — Encounter: Payer: Self-pay | Admitting: Advanced Practice Midwife

## 2016-11-17 DIAGNOSIS — O99820 Streptococcus B carrier state complicating pregnancy: Secondary | ICD-10-CM | POA: Insufficient documentation

## 2016-11-17 LAB — GC/CHLAMYDIA PROBE AMP (~~LOC~~) NOT AT ARMC
CHLAMYDIA, DNA PROBE: NEGATIVE
NEISSERIA GONORRHEA: NEGATIVE

## 2016-11-22 ENCOUNTER — Ambulatory Visit (INDEPENDENT_AMBULATORY_CARE_PROVIDER_SITE_OTHER): Payer: Medicaid Other | Admitting: Medical

## 2016-11-22 VITALS — BP 112/63 | HR 102 | Wt 329.6 lb

## 2016-11-22 DIAGNOSIS — Z3493 Encounter for supervision of normal pregnancy, unspecified, third trimester: Secondary | ICD-10-CM

## 2016-11-22 NOTE — Progress Notes (Signed)
   PRENATAL VISIT NOTE  Subjective:  Paula Sandoval is a 26 y.o. G2P1001 at [redacted]w[redacted]d being seen today for ongoing prenatal care.  She is currently monitored for the following issues for this low-risk pregnancy and has Supervision of low-risk pregnancy; Morbid obesity with BMI of 45.0-49.9, adult ([place]); Previous cesarean delivery, antepartum condition or complication; and Group B Streptococcus carrier, antepartum on her problem list.  Patient reports no complaints.  Contractions: Not present. Vag. Bleeding: None.  Movement: Present. Denies leaking of fluid.   The following portions of the patient's history were reviewed and updated as appropriate: allergies, current medications, past family history, past medical history, past social history, past surgical history and problem list. Problem list updated.  Objective:   Vitals:   11/22/16 0823  BP: 112/63  Pulse: (!) 102  Weight: (!) 329 lb 9.6 oz (149.5 kg)    Fetal Status: Fetal Heart Rate (bpm): 132 Fundal Height: 38 cm Movement: Present  Presentation: Vertex  General:  Alert, oriented and cooperative. Patient is in no acute distress.  Skin: Skin is warm and dry. No rash noted.   Cardiovascular: Normal heart rate noted  Respiratory: Normal respiratory effort, no problems with respiration noted  Abdomen: Soft, gravid, appropriate for gestational age. Pain/Pressure: Present     Pelvic:  Cervical exam performed Dilation: Closed Effacement (%): 0 Station: Ballotable  Extremities: Normal range of motion.  Edema: None  Mental Status: Normal mood and affect. Normal behavior. Normal judgment and thought content.   Assessment and Plan:  Pregnancy: G2P1001 at [redacted]w[redacted]d  1. Encounter for supervision of low-risk pregnancy in third trimester - Doing well, no complaints - + GBS at last visit, discussed need for treatment in labor with patient   Preterm labor symptoms and general obstetric precautions including but not limited to vaginal bleeding,  contractions, leaking of fluid and fetal movement were reviewed in detail with the patient. Please refer to After Visit Summary for other counseling recommendations.  Return in about 1 week (around 11/29/2016) for LOB.   Luvenia Redden, PA-C

## 2016-11-22 NOTE — Patient Instructions (Signed)
Introduction Patient Name: ________________________________________________ Patient Due Date: ____________________ What is a fetal movement count? A fetal movement count is the number of times that you feel your baby move during a certain amount of time. This may also be called a fetal kick count. A fetal movement count is recommended for every pregnant woman. You may be asked to start counting fetal movements as early as week 28 of your pregnancy. Pay attention to when your baby is most active. You may notice your baby's sleep and wake cycles. You may also notice things that make your baby move more. You should do a fetal movement count:  When your baby is normally most active.  At the same time each day. A good time to count movements is while you are resting, after having something to eat and drink. How do I count fetal movements? 1. Find a quiet, comfortable area. Sit, or lie down on your side. 2. Write down the date, the start time and stop time, and the number of movements that you felt between those two times. Take this information with you to your health care visits. 3. For 2 hours, count kicks, flutters, swishes, rolls, and jabs. You should feel at least 10 movements during 2 hours. 4. You may stop counting after you have felt 10 movements. 5. If you do not feel 10 movements in 2 hours, have something to eat and drink. Then, keep resting and counting for 1 hour. If you feel at least 4 movements during that hour, you may stop counting. Contact a health care provider if:  You feel fewer than 4 movements in 2 hours.  Your baby is not moving like he or she usually does. Date: ____________ Start time: ____________ Stop time: ____________ Movements: ____________ Date: ____________ Start time: ____________ Stop time: ____________ Movements: ____________ Date: ____________ Start time: ____________ Stop time: ____________ Movements: ____________ Date: ____________ Start time: ____________  Stop time: ____________ Movements: ____________ Date: ____________ Start time: ____________ Stop time: ____________ Movements: ____________ Date: ____________ Start time: ____________ Stop time: ____________ Movements: ____________ Date: ____________ Start time: ____________ Stop time: ____________ Movements: ____________ Date: ____________ Start time: ____________ Stop time: ____________ Movements: ____________ Date: ____________ Start time: ____________ Stop time: ____________ Movements: ____________ This information is not intended to replace advice given to you by your health care provider. Make sure you discuss any questions you have with your health care provider. Document Released: 11/16/2006 Document Revised: 06/15/2016 Document Reviewed: 11/26/2015 Elsevier Interactive Patient Education  2017 Elsevier Inc[employer] of the uterus can occur throughout pregnancy. Contractions are not always a sign that you are in labor.  WHAT ARE BRAXTON HICKS CONTRACTIONS?  Contractions that occur before labor are called Braxton Hicks contractions, or false labor. Toward the end of pregnancy (32-34 weeks), these contractions can develop more often and may become more forceful. This is not true labor because these contractions do not result in opening (dilatation) and thinning of the cervix. They are sometimes difficult to tell apart from true labor because these contractions can be forceful and people have different pain tolerances. You should not feel embarrassed if you go to the hospital with false labor. Sometimes, the only way to tell if you are in true labor is for your health care provider to look for changes in the cervix. If there are no prenatal problems or other health problems associated with the pregnancy, it is completely safe to be sent home with false labor and await the onset of true labor.  HOW CAN YOU TELL THE DIFFERENCE BETWEEN TRUE AND FALSE LABOR? False Labor     The contractions of false labor are usually shorter and not as hard as those of true labor.   The contractions are usually irregular.   The contractions are often felt in the front of the lower abdomen and in the groin.   The contractions may go away when you walk around or change positions while lying down.   The contractions get weaker and are shorter lasting as time goes on.   The contractions do not usually become progressively stronger, regular, and closer together as with true labor.  True Labor   Contractions in true labor last 30-70 seconds, become very regular, usually become more intense, and increase in frequency.   The contractions do not go away with walking.   The discomfort is usually felt in the top of the uterus and spreads to the lower abdomen and low back.   True labor can be determined by your health care provider with an exam. This will show that the cervix is dilating and getting thinner.  WHAT TO REMEMBER  Keep up with your usual exercises and follow other instructions given by your health care provider.   Take medicines as directed by your health care provider.   Keep your regular prenatal appointments.   Eat and drink lightly if you think you are going into labor.   If Braxton Hicks contractions are making you uncomfortable:   Change your position from lying down or resting to walking, or from walking to resting.   Sit and rest in a tub of warm water.   Drink 2-3 glasses of water. Dehydration may cause these contractions.   Do slow and deep breathing several times an hour.  WHEN SHOULD I SEEK IMMEDIATE MEDICAL CARE? Seek immediate medical care if:  Your contractions become stronger, more regular, and closer together.   You have fluid leaking or gushing from your vagina.   You have a fever.   You pass blood-tinged mucus.   You have vaginal bleeding.   You have continuous abdominal pain.   You have low back pain  that you never had before.   You feel your baby's head pushing down and causing pelvic pressure.   Your baby is not moving as much as it used to.  This information is not intended to replace advice given to you by your health care provider. Make sure you discuss any questions you have with your health care provider. Document Released: 10/17/2005 Document Revised: 02/08/2016 Document Reviewed: 07/29/2013 Elsevier Interactive Patient Education  2017 [employer].

## 2016-11-29 ENCOUNTER — Ambulatory Visit (INDEPENDENT_AMBULATORY_CARE_PROVIDER_SITE_OTHER): Payer: Self-pay | Admitting: Medical

## 2016-11-29 VITALS — BP 123/64 | HR 102 | Wt 334.6 lb

## 2016-11-29 DIAGNOSIS — Z3493 Encounter for supervision of normal pregnancy, unspecified, third trimester: Secondary | ICD-10-CM

## 2016-11-29 NOTE — Progress Notes (Signed)
   PRENATAL VISIT NOTE  Subjective:  Paula Sandoval is a 26 y.o. G2P1001 at [redacted]w[redacted]d being seen today for ongoing prenatal care.  She is currently monitored for the following issues for this low-risk pregnancy and has Supervision of low-risk pregnancy; Morbid obesity with BMI of 45.0-49.9, adult ([place]); Previous cesarean delivery, antepartum condition or complication; and Group B Streptococcus carrier, antepartum on her problem list.  Patient reports no complaints.  Contractions: Irregular. Vag. Bleeding: None.  Movement: Present. Denies leaking of fluid.   The following portions of the patient's history were reviewed and updated as appropriate: allergies, current medications, past family history, past medical history, past social history, past surgical history and problem list. Problem list updated.  Objective:   Vitals:   11/29/16 0758  BP: 123/64  Pulse: (!) 102  Weight: (!) 334 lb 9.6 oz (151.8 kg)    Fetal Status: Fetal Heart Rate (bpm): 132 Fundal Height: 39 cm Movement: Present  Presentation: Vertex  General:  Alert, oriented and cooperative. Patient is in no acute distress.  Skin: Skin is warm and dry. No rash noted.   Cardiovascular: Normal heart rate noted  Respiratory: Normal respiratory effort, no problems with respiration noted  Abdomen: Soft, gravid, appropriate for gestational age. Pain/Pressure: Present     Pelvic:  Cervical exam performed Dilation: Fingertip Effacement (%): 20 Station: Ballotable  Extremities: Normal range of motion.  Edema: None  Mental Status: Normal mood and affect. Normal behavior. Normal judgment and thought content.   Assessment and Plan:  Pregnancy: G2P1001 at [redacted]w[redacted]d  1. Encounter for supervision of low-risk pregnancy in third trimester - Advised of +GBS, patient voiced understanding  Term labor symptoms and general obstetric precautions including but not limited to vaginal bleeding, contractions, leaking of fluid and fetal movement were  reviewed in detail with the patient. Please refer to After Visit Summary for other counseling recommendations.  Return in about 1 week (around 12/06/2016) for LOB.   Luvenia Redden, PA-C

## 2016-11-29 NOTE — Patient Instructions (Signed)
Braxton Hicks Contractions Contractions of the uterus can occur throughout pregnancy. Contractions are not always a sign that you are in labor.  WHAT ARE BRAXTON HICKS CONTRACTIONS?  Contractions that occur before labor are called Braxton Hicks contractions, or false labor. Toward the end of pregnancy (32-34 weeks), these contractions can develop more often and may become more forceful. This is not true labor because these contractions do not result in opening (dilatation) and thinning of the cervix. They are sometimes difficult to tell apart from true labor because these contractions can be forceful and people have different pain tolerances. You should not feel embarrassed if you go to the hospital with false labor. Sometimes, the only way to tell if you are in true labor is for your health care provider to look for changes in the cervix. If there are no prenatal problems or other health problems associated with the pregnancy, it is completely safe to be sent home with false labor and await the onset of true labor. HOW CAN YOU TELL THE DIFFERENCE BETWEEN TRUE AND FALSE LABOR? False Labor   The contractions of false labor are usually shorter and not as hard as those of true labor.   The contractions are usually irregular.   The contractions are often felt in the front of the lower abdomen and in the groin.   The contractions may go away when you walk around or change positions while lying down.   The contractions get weaker and are shorter lasting as time goes on.   The contractions do not usually become progressively stronger, regular, and closer together as with true labor.  True Labor   Contractions in true labor last 30-70 seconds, become very regular, usually become more intense, and increase in frequency.   The contractions do not go away with walking.   The discomfort is usually felt in the top of the uterus and spreads to the lower abdomen and low back.   True labor can be  determined by your health care provider with an exam. This will show that the cervix is dilating and getting thinner.  WHAT TO REMEMBER  Keep up with your usual exercises and follow other instructions given by your health care provider.   Take medicines as directed by your health care provider.   Keep your regular prenatal appointments.   Eat and drink lightly if you think you are going into labor.   If Braxton Hicks contractions are making you uncomfortable:   Change your position from lying down or resting to walking, or from walking to resting.   Sit and rest in a tub of warm water.   Drink 2-3 glasses of water. Dehydration may cause these contractions.   Do slow and deep breathing several times an hour.  WHEN SHOULD I SEEK IMMEDIATE MEDICAL CARE? Seek immediate medical care if:  Your contractions become stronger, more regular, and closer together.   You have fluid leaking or gushing from your vagina.   You have a fever.   You pass blood-tinged mucus.   You have vaginal bleeding.   You have continuous abdominal pain.   You have low back pain that you never had before.   You feel your baby's head pushing down and causing pelvic pressure.   Your baby is not moving as much as it used to.  This information is not intended to replace advice given to you by your health care provider. Make sure you discuss any questions you have with your health care  provider. Document Released: 10/17/2005 Document Revised: 02/08/2016 Document Reviewed: 07/29/2013 Elsevier Interactive Patient Education  2017 [employer]. Introduction Patient Name: ________________________________________________ Patient Due Date: ____________________ What is a fetal movement count? A fetal movement count is the number of times that you feel your baby move during a certain amount of time. This may also be called a fetal kick count. A fetal movement count is recommended for every pregnant  woman. You may be asked to start counting fetal movements as early as week 28 of your pregnancy. Pay attention to when your baby is most active. You may notice your baby's sleep and wake cycles. You may also notice things that make your baby move more. You should do a fetal movement count:  When your baby is normally most active.  At the same time each day. A good time to count movements is while you are resting, after having something to eat and drink. How do I count fetal movements? 1. Find a quiet, comfortable area. Sit, or lie down on your side. 2. Write down the date, the start time and stop time, and the number of movements that you felt between those two times. Take this information with you to your health care visits. 3. For 2 hours, count kicks, flutters, swishes, rolls, and jabs. You should feel at least 10 movements during 2 hours. 4. You may stop counting after you have felt 10 movements. 5. If you do not feel 10 movements in 2 hours, have something to eat and drink. Then, keep resting and counting for 1 hour. If you feel at least 4 movements during that hour, you may stop counting. Contact a health care provider if:  You feel fewer than 4 movements in 2 hours.  Your baby is not moving like he or she usually does. Date: ____________ Start time: ____________ Stop time: ____________ Movements: ____________ Date: ____________ Start time: ____________ Stop time: ____________ Movements: ____________ Date: ____________ Start time: ____________ Stop time: ____________ Movements: ____________ Date: ____________ Start time: ____________ Stop time: ____________ Movements: ____________ Date: ____________ Start time: ____________ Stop time: ____________ Movements: ____________ Date: ____________ Start time: ____________ Stop time: ____________ Movements: ____________ Date: ____________ Start time: ____________ Stop time: ____________ Movements: ____________ Date: ____________ Start time:  ____________ Stop time: ____________ Movements: ____________ Date: ____________ Start time: ____________ Stop time: ____________ Movements: ____________ This information is not intended to replace advice given to you by your health care provider. Make sure you discuss any questions you have with your health care provider. Document Released: 11/16/2006 Document Revised: 06/15/2016 Document Reviewed: 11/26/2015 Elsevier Interactive Patient Education  2017 [employer].

## 2016-12-05 ENCOUNTER — Encounter: Payer: Self-pay | Admitting: Advanced Practice Midwife

## 2016-12-06 ENCOUNTER — Ambulatory Visit (INDEPENDENT_AMBULATORY_CARE_PROVIDER_SITE_OTHER): Payer: Medicaid Other | Admitting: Medical

## 2016-12-06 VITALS — BP 109/73 | HR 119 | Wt 331.8 lb

## 2016-12-06 DIAGNOSIS — Z3493 Encounter for supervision of normal pregnancy, unspecified, third trimester: Secondary | ICD-10-CM

## 2016-12-06 NOTE — Patient Instructions (Signed)
Braxton Hicks Contractions Contractions of the uterus can occur throughout pregnancy. Contractions are not always a sign that you are in labor.  WHAT ARE BRAXTON HICKS CONTRACTIONS?  Contractions that occur before labor are called Braxton Hicks contractions, or false labor. Toward the end of pregnancy (32-34 weeks), these contractions can develop more often and may become more forceful. This is not true labor because these contractions do not result in opening (dilatation) and thinning of the cervix. They are sometimes difficult to tell apart from true labor because these contractions can be forceful and people have different pain tolerances. You should not feel embarrassed if you go to the hospital with false labor. Sometimes, the only way to tell if you are in true labor is for your health care provider to look for changes in the cervix. If there are no prenatal problems or other health problems associated with the pregnancy, it is completely safe to be sent home with false labor and await the onset of true labor. HOW CAN YOU TELL THE DIFFERENCE BETWEEN TRUE AND FALSE LABOR? False Labor   The contractions of false labor are usually shorter and not as hard as those of true labor.   The contractions are usually irregular.   The contractions are often felt in the front of the lower abdomen and in the groin.   The contractions may go away when you walk around or change positions while lying down.   The contractions get weaker and are shorter lasting as time goes on.   The contractions do not usually become progressively stronger, regular, and closer together as with true labor.  True Labor   Contractions in true labor last 30-70 seconds, become very regular, usually become more intense, and increase in frequency.   The contractions do not go away with walking.   The discomfort is usually felt in the top of the uterus and spreads to the lower abdomen and low back.   True labor can be  determined by your health care provider with an exam. This will show that the cervix is dilating and getting thinner.  WHAT TO REMEMBER  Keep up with your usual exercises and follow other instructions given by your health care provider.   Take medicines as directed by your health care provider.   Keep your regular prenatal appointments.   Eat and drink lightly if you think you are going into labor.   If Braxton Hicks contractions are making you uncomfortable:   Change your position from lying down or resting to walking, or from walking to resting.   Sit and rest in a tub of warm water.   Drink 2-3 glasses of water. Dehydration may cause these contractions.   Do slow and deep breathing several times an hour.  WHEN SHOULD I SEEK IMMEDIATE MEDICAL CARE? Seek immediate medical care if:  Your contractions become stronger, more regular, and closer together.   You have fluid leaking or gushing from your vagina.   You have a fever.   You pass blood-tinged mucus.   You have vaginal bleeding.   You have continuous abdominal pain.   You have low back pain that you never had before.   You feel your baby's head pushing down and causing pelvic pressure.   Your baby is not moving as much as it used to.  This information is not intended to replace advice given to you by your health care provider. Make sure you discuss any questions you have with your health care  provider. Document Released: 10/17/2005 Document Revised: 02/08/2016 Document Reviewed: 07/29/2013 Elsevier Interactive Patient Education  2017 [employer]. Introduction Patient Name: ________________________________________________ Patient Due Date: ____________________ What is a fetal movement count? A fetal movement count is the number of times that you feel your baby move during a certain amount of time. This may also be called a fetal kick count. A fetal movement count is recommended for every pregnant  woman. You may be asked to start counting fetal movements as early as week 28 of your pregnancy. Pay attention to when your baby is most active. You may notice your baby's sleep and wake cycles. You may also notice things that make your baby move more. You should do a fetal movement count:  When your baby is normally most active.  At the same time each day. A good time to count movements is while you are resting, after having something to eat and drink. How do I count fetal movements? 1. Find a quiet, comfortable area. Sit, or lie down on your side. 2. Write down the date, the start time and stop time, and the number of movements that you felt between those two times. Take this information with you to your health care visits. 3. For 2 hours, count kicks, flutters, swishes, rolls, and jabs. You should feel at least 10 movements during 2 hours. 4. You may stop counting after you have felt 10 movements. 5. If you do not feel 10 movements in 2 hours, have something to eat and drink. Then, keep resting and counting for 1 hour. If you feel at least 4 movements during that hour, you may stop counting. Contact a health care provider if:  You feel fewer than 4 movements in 2 hours.  Your baby is not moving like he or she usually does. Date: ____________ Start time: ____________ Stop time: ____________ Movements: ____________ Date: ____________ Start time: ____________ Stop time: ____________ Movements: ____________ Date: ____________ Start time: ____________ Stop time: ____________ Movements: ____________ Date: ____________ Start time: ____________ Stop time: ____________ Movements: ____________ Date: ____________ Start time: ____________ Stop time: ____________ Movements: ____________ Date: ____________ Start time: ____________ Stop time: ____________ Movements: ____________ Date: ____________ Start time: ____________ Stop time: ____________ Movements: ____________ Date: ____________ Start time:  ____________ Stop time: ____________ Movements: ____________ Date: ____________ Start time: ____________ Stop time: ____________ Movements: ____________ This information is not intended to replace advice given to you by your health care provider. Make sure you discuss any questions you have with your health care provider. Document Released: 11/16/2006 Document Revised: 06/15/2016 Document Reviewed: 11/26/2015 Elsevier Interactive Patient Education  2017 [employer].

## 2016-12-06 NOTE — Progress Notes (Signed)
   PRENATAL VISIT NOTE  Subjective:  Paula Sandoval is a 26 y.o. G2P1001 at [redacted]w[redacted]d being seen today for ongoing prenatal care.  She is currently monitored for the following issues for this low-risk pregnancy and has Supervision of low-risk pregnancy; Morbid obesity with BMI of 45.0-49.9, adult ([place]); Previous cesarean delivery, antepartum condition or complication; and Group B Streptococcus carrier, antepartum on her problem list.  Patient reports no complaints.  Contractions: Irregular. Vag. Bleeding: None.  Movement: Present. Denies leaking of fluid.   The following portions of the patient's history were reviewed and updated as appropriate: allergies, current medications, past family history, past medical history, past social history, past surgical history and problem list. Problem list updated.  Objective:   Vitals:   12/06/16 0756 12/06/16 0804  BP: 132/65 109/73  Pulse: (!) 119   Weight: (!) 331 lb 12.8 oz (150.5 kg)     Fetal Status: Fetal Heart Rate (bpm): 143 Fundal Height: 39 cm Movement: Present  Presentation: Vertex  General:  Alert, oriented and cooperative. Patient is in no acute distress.  Skin: Skin is warm and dry. No rash noted.   Cardiovascular: Normal heart rate noted  Respiratory: Normal respiratory effort, no problems with respiration noted  Abdomen: Soft, gravid, appropriate for gestational age. Pain/Pressure: Present     Pelvic:  Cervical exam performed Dilation: Fingertip Effacement (%): 50 Station: Ballotable  Extremities: Normal range of motion.  Edema: Mild pitting, slight indentation  Mental Status: Normal mood and affect. Normal behavior. Normal judgment and thought content.   Assessment and Plan:  Pregnancy: G2P1001 at [redacted]w[redacted]d  1. Encounter for supervision of low-risk pregnancy in third trimester - Patient doing well. Planned VBAC  Term labor symptoms and general obstetric precautions including but not limited to vaginal bleeding, contractions, leaking  of fluid and fetal movement were reviewed in detail with the patient. Please refer to After Visit Summary for other counseling recommendations.  Return in about 1 week (around 12/13/2016) for LOB.   Luvenia Redden, PA-C

## 2016-12-13 ENCOUNTER — Ambulatory Visit (INDEPENDENT_AMBULATORY_CARE_PROVIDER_SITE_OTHER): Payer: Medicaid Other | Admitting: Family

## 2016-12-13 VITALS — BP 113/71 | HR 105 | Wt 331.1 lb

## 2016-12-13 DIAGNOSIS — Z3493 Encounter for supervision of normal pregnancy, unspecified, third trimester: Secondary | ICD-10-CM

## 2016-12-13 DIAGNOSIS — O34219 Maternal care for unspecified type scar from previous cesarean delivery: Secondary | ICD-10-CM

## 2016-12-13 NOTE — Patient Instructions (Signed)
Trial of Labor After Cesarean Delivery Information  A trial of labor after cesarean delivery (TOLAC) is when a woman tries to give birth vaginally after a previous cesarean delivery. TOLAC may be a safe and appropriate option for you depending on your medical history and other risk factors. When TOLAC is successful and you are able to have a vaginal delivery, this is called a vaginal birth after cesarean delivery (VBAC).   CANDIDATES FOR TOLAC  TOLAC is possible for some women who:   Have undergone one or two prior cesarean deliveries in which the incision of the uterus was horizontal (low transverse).   Are carrying twins and have had one prior low transverse incision during a cesarean delivery.   Do not have a vertical (classical) uterine scar.   Have not had a tear in the wall of their uterus (uterine rupture).  TOLAC is also supported for women who meet appropriate criteria and:   Are under the age of 40 years.   Are tall and have a body mass index (BMI) of less than 30.   Have an unknown uterine scar.   Give birth in a facility equipped to handle an emergency cesarean delivery. This team should be able to handle possible complications such as a uterine rupture.   Have thorough counseling about the benefits and risks of TOLAC.   Have discussed future pregnancy plans with their health care provider.   Plan to have several more pregnancies.  MOST SUCCESSFUL CANDIDATES FOR TOLAC:   Have had a successful vaginal delivery before or after their cesarean delivery.   Experience labor that begins naturally on or before the due date (40 weeks of gestation).   Do not have a very large (macrosomic) baby.    Had a prior cesarean delivery but are not currently experiencing factors that would prompt a cesarean delivery (such as a breech position).   Had only one prior cesarean delivery.   Had a prior cesarean delivery that was performed early in labor and not after full cervical dilation.  TOLAC may be most  appropriate for women who meet the above guidelines and who plan to have more pregnancies. TOLAC is not recommended for home births.  LEAST SUCCESSFUL CANDIDATES FOR TOLAC:   Have an induced labor with an unfavorable cervix. An unfavorable cervix is when the cervix is not dilating enough (among other factors).   Have never had a vaginal delivery.   Have had more than two cesarean deliveries.   Have a pregnancy at more than 40 weeks of gestation.   Are pregnant with a baby with a suspected weight greater than 4,000 grams (8 pounds) and who have no prior history of a vaginal delivery.   Have closely spaced pregnancies.  SUGGESTED BENEFITS OF TOLAC   You may have a faster recovery time.   You may have a shorter stay in the hospital.   You may have less pain and fewer problems than with a cesarean delivery. Women who have a cesarean delivery have a higher chance of needing blood or getting a fever, an infection, or a blood clot in the legs.  SUGGESTED RISKS OF TOLAC  The highest risk of complications happens to women who attempt a TOLAC and fail. A failed TOLAC results in an unplanned cesarean delivery. Risks related to TOLAC or repeat cesarean deliveries include:    Blood loss.   Infection.   Blood clot.   Injury to surrounding tissues or organs.   Having to remove the   uterus (hysterectomy).   Potential problems with the placenta (such as placenta previa or placenta accreta) in future pregnancies.  Although very rare, the main concerns with TOLAC are:   Rupture of the uterine scar from a past cesarean delivery.   Needing an emergency cesarean delivery.   Having a bad outcome for the baby (perinatal morbidity).  FOR MORE INFORMATION  American Congress of Obstetricians and Gynecologists: www.acog.org  American College of Nurse-Midwives: www.midwife.org     This information is not intended to replace advice given to you by your health care provider. Make sure you discuss any questions you have with  your health care provider.     Document Released: 07/05/2011 Document Revised: 08/07/2013 Document Reviewed: 04/08/2013  Elsevier Interactive Patient Education 2017 [employer] Inc[employer]

## 2016-12-13 NOTE — Progress Notes (Signed)
   PRENATAL VISIT NOTE  Subjective:  Paula Sandoval is a 26 y.o. G2P1001 at [redacted]w[redacted]d being seen today for ongoing prenatal care.  She is currently monitored for the following issues for this low-risk pregnancy and has Supervision of low-risk pregnancy; Morbid obesity with BMI of 45.0-49.9, adult ([place]); Previous cesarean delivery, antepartum condition or complication; and Group B Streptococcus carrier, antepartum on her problem list.  Patient reports no complaints.  Contractions: Irregular. Vag. Bleeding: None.  Movement: Present. Denies leaking of fluid.   The following portions of the patient's history were reviewed and updated as appropriate: allergies, current medications, past family history, past medical history, past social history, past surgical history and problem list. Problem list updated.  Objective:   Vitals:   12/13/16 0742  BP: 113/71  Pulse: (!) 105  Weight: (!) 331 lb 1.6 oz (150.2 kg)    Fetal Status: Fetal Heart Rate (bpm): 128   Movement: Present     General:  Alert, oriented and cooperative. Patient is in no acute distress.  Skin: Skin is warm and dry. No rash noted.   Cardiovascular: Normal heart rate noted  Respiratory: Normal respiratory effort, no problems with respiration noted  Abdomen: Soft, gravid, appropriate for gestational age. Pain/Pressure: Present     Pelvic:  Cervical exam performed      1/50  Extremities: Normal range of motion.  Edema: Trace  Mental Status: Normal mood and affect. Normal behavior. Normal judgment and thought content.   Assessment and Plan:  Pregnancy: G2P1001 at [redacted]w[redacted]d  1. Previous cesarean delivery, antepartum condition or complication - Discussed IOL options for someone with hx of csection (Foley Bulb, pitocin) - Consent verified - Warning signs to report  2. Encounter for supervision of low-risk pregnancy in third trimester - Reviewed signs of labor - Discussed NST at next visit and IOL at 41 wks  Term labor symptoms and  general obstetric precautions including but not limited to vaginal bleeding, contractions, leaking of fluid and fetal movement were reviewed in detail with the patient. Please refer to After Visit Summary for other counseling recommendations.  Return in 1 week (on 12/20/2016) for appt and NST.   Venia Carbon Michiel Cowboy, CNM

## 2016-12-16 ENCOUNTER — Encounter: Payer: Self-pay | Admitting: Advanced Practice Midwife

## 2016-12-20 ENCOUNTER — Telehealth (HOSPITAL_COMMUNITY): Payer: Self-pay | Admitting: *Deleted

## 2016-12-20 ENCOUNTER — Encounter (HOSPITAL_COMMUNITY): Payer: Self-pay | Admitting: *Deleted

## 2016-12-20 ENCOUNTER — Ambulatory Visit (INDEPENDENT_AMBULATORY_CARE_PROVIDER_SITE_OTHER): Payer: Medicaid Other | Admitting: Advanced Practice Midwife

## 2016-12-20 ENCOUNTER — Encounter: Payer: Self-pay | Admitting: Physician Assistant

## 2016-12-20 VITALS — BP 100/66 | HR 128 | Wt 328.8 lb

## 2016-12-20 DIAGNOSIS — O480 Post-term pregnancy: Secondary | ICD-10-CM | POA: Diagnosis present

## 2016-12-20 NOTE — Telephone Encounter (Signed)
This was sent to my inbox, pt is currently a patient in your office, thanks.

## 2016-12-20 NOTE — Telephone Encounter (Signed)
Preadmission screen  

## 2016-12-20 NOTE — Progress Notes (Signed)
   PRENATAL VISIT NOTE  Subjective:  Paula Sandoval is a 26 y.o. G2P1001 at [redacted]w[redacted]d being seen today for ongoing prenatal care.  She is currently monitored for the following issues for this low-risk pregnancy and has Supervision of low-risk pregnancy; Morbid obesity with BMI of 45.0-49.9, adult ([place]); Previous cesarean delivery, antepartum condition or complication; and Group B Streptococcus carrier, antepartum on her problem list.  Patient reports occasional contractions.  Contractions: Irregular. Vag. Bleeding: None.  Movement: Present. Denies leaking of fluid.   The following portions of the patient's history were reviewed and updated as appropriate: allergies, current medications, past family history, past medical history, past social history, past surgical history and problem list. Problem list updated.  Objective:   Vitals:   12/20/16 0755  BP: 100/66  Pulse: (!) 128  Weight: (!) 328 lb 12.8 oz (149.1 kg)    Fetal Status: Fetal Heart Rate (bpm): NST Reactive   Movement: Present     General:  Alert, oriented and cooperative. Patient is in no acute distress.  Skin: Skin is warm and dry. No rash noted.   Cardiovascular: Normal heart rate noted  Respiratory: Normal respiratory effort, no problems with respiration noted  Abdomen: Soft, gravid, appropriate for gestational age. Pain/Pressure: Present     Pelvic:  Cervical exam deferred        Extremities: Normal range of motion.     Mental Status: Normal mood and affect. Normal behavior. Normal judgment and thought content.   Assessment and Plan:  Pregnancy: G2P1001 at [redacted]w[redacted]d  1. Post term pregnancy, antepartum condition or complication  - Fetal nonstress test  2. Supervision of normal pregnancy  Term labor symptoms and general obstetric precautions including but not limited to vaginal bleeding, contractions, leaking of fluid and fetal movement were reviewed in detail with the patient. Please refer to After Visit Summary for  other counseling recommendations.  Return in about 3 days (around 12/23/2016) for NST/AFI only.   Manya Silvas, CNM

## 2016-12-20 NOTE — Patient Instructions (Signed)
Braxton Hicks Contractions °Contractions of the uterus can occur throughout pregnancy. Contractions are not always a sign that you are in labor.  °WHAT ARE BRAXTON HICKS CONTRACTIONS?  °Contractions that occur before labor are called Braxton Hicks contractions, or false labor. Toward the end of pregnancy (32-34 weeks), these contractions can develop more often and may become more forceful. This is not true labor because these contractions do not result in opening (dilatation) and thinning of the cervix. They are sometimes difficult to tell apart from true labor because these contractions can be forceful and people have different pain tolerances. You should not feel embarrassed if you go to the hospital with false labor. Sometimes, the only way to tell if you are in true labor is for your health care provider to look for changes in the cervix. °If there are no prenatal problems or other health problems associated with the pregnancy, it is completely safe to be sent home with false labor and await the onset of true labor. °HOW CAN YOU TELL THE DIFFERENCE BETWEEN TRUE AND FALSE LABOR? °False Labor  °· The contractions of false labor are usually shorter and not as hard as those of true labor.   °· The contractions are usually irregular.   °· The contractions are often felt in the front of the lower abdomen and in the groin.   °· The contractions may go away when you walk around or change positions while lying down.   °· The contractions get weaker and are shorter lasting as time goes on.   °· The contractions do not usually become progressively stronger, regular, and closer together as with true labor.   °True Labor  °· Contractions in true labor last 30-70 seconds, become very regular, usually become more intense, and increase in frequency.   °· The contractions do not go away with walking.   °· The discomfort is usually felt in the top of the uterus and spreads to the lower abdomen and low back.   °· True labor can be  determined by your health care provider with an exam. This will show that the cervix is dilating and getting thinner.   °WHAT TO REMEMBER °· Keep up with your usual exercises and follow other instructions given by your health care provider.   °· Take medicines as directed by your health care provider.   °· Keep your regular prenatal appointments.   °· Eat and drink lightly if you think you are going into labor.   °· If Braxton Hicks contractions are making you uncomfortable:   °¨ Change your position from lying down or resting to walking, or from walking to resting.   °¨ Sit and rest in a tub of warm water.   °¨ Drink 2-3 glasses of water. Dehydration may cause these contractions.   °¨ Do slow and deep breathing several times an hour.   °WHEN SHOULD I SEEK IMMEDIATE MEDICAL CARE? °Seek immediate medical care if: °· Your contractions become stronger, more regular, and closer together.   °· You have fluid leaking or gushing from your vagina.   °· You have a fever.   °· You pass blood-tinged mucus.   °· You have vaginal bleeding.   °· You have continuous abdominal pain.   °· You have low back pain that you never had before.   °· You feel your baby's head pushing down and causing pelvic pressure.   °· Your baby is not moving as much as it used to.   °This information is not intended to replace advice given to you by your health care provider. Make sure you discuss any questions you have with your health care   provider. °Document Released: 10/17/2005 Document Revised: 02/08/2016 Document Reviewed: 07/29/2013 °Elsevier Interactive Patient Education © 2017 Elsevier Inc. ° °

## 2016-12-23 ENCOUNTER — Ambulatory Visit: Payer: Self-pay

## 2016-12-23 ENCOUNTER — Ambulatory Visit (INDEPENDENT_AMBULATORY_CARE_PROVIDER_SITE_OTHER): Payer: BLUE CROSS/BLUE SHIELD | Admitting: Obstetrics & Gynecology

## 2016-12-23 VITALS — BP 109/80 | HR 128

## 2016-12-23 DIAGNOSIS — Z3689 Encounter for other specified antenatal screening: Secondary | ICD-10-CM

## 2016-12-23 DIAGNOSIS — O480 Post-term pregnancy: Secondary | ICD-10-CM | POA: Diagnosis not present

## 2016-12-23 NOTE — Progress Notes (Signed)
Pt informed that the ultrasound is considered a limited OB ultrasound and is not intended to be a complete ultrasound exam.  Patient also informed that the ultrasound is not being completed with the intent of assessing for fetal or placental anomalies or any pelvic abnormalities.  Explained that the purpose of today's ultrasound is to assess for presentation and amniotic fluid volume.  Patient acknowledges the purpose of the exam and the limitations of the study.    IOL scheduled 12/27/16 @ 0700.  Instructions given.

## 2016-12-27 ENCOUNTER — Inpatient Hospital Stay (HOSPITAL_COMMUNITY): Payer: Medicaid Other | Admitting: Anesthesiology

## 2016-12-27 ENCOUNTER — Encounter (HOSPITAL_COMMUNITY): Payer: Self-pay

## 2016-12-27 ENCOUNTER — Inpatient Hospital Stay (HOSPITAL_COMMUNITY)
Admission: RE | Admit: 2016-12-27 | Discharge: 2016-12-29 | DRG: 775 | Disposition: A | Discharge status: Home / Self Care | Payer: Medicaid Other | Source: Ambulatory Visit | Attending: Family Medicine | Admitting: Family Medicine

## 2016-12-27 DIAGNOSIS — O34211 Maternal care for low transverse scar from previous cesarean delivery: Secondary | ICD-10-CM | POA: Diagnosis present

## 2016-12-27 DIAGNOSIS — O480 Post-term pregnancy: Secondary | ICD-10-CM | POA: Diagnosis present

## 2016-12-27 DIAGNOSIS — Z6841 Body Mass Index (BMI) 40.0 and over, adult: Secondary | ICD-10-CM | POA: Diagnosis not present

## 2016-12-27 DIAGNOSIS — O99824 Streptococcus B carrier state complicating childbirth: Secondary | ICD-10-CM | POA: Diagnosis present

## 2016-12-27 DIAGNOSIS — Z87891 Personal history of nicotine dependence: Secondary | ICD-10-CM

## 2016-12-27 DIAGNOSIS — O99214 Obesity complicating childbirth: Secondary | ICD-10-CM | POA: Diagnosis present

## 2016-12-27 DIAGNOSIS — O99820 Streptococcus B carrier state complicating pregnancy: Secondary | ICD-10-CM

## 2016-12-27 DIAGNOSIS — Z3A41 41 weeks gestation of pregnancy: Secondary | ICD-10-CM

## 2016-12-27 LAB — TYPE AND SCREEN
ABO/RH(D): O POS
Antibody Screen: NEGATIVE

## 2016-12-27 LAB — CBC
HCT: 32.8 % — ABNORMAL LOW (ref 36.0–46.0)
Hemoglobin: 10.8 g/dL — ABNORMAL LOW (ref 12.0–15.0)
MCH: 24.7 pg — ABNORMAL LOW (ref 26.0–34.0)
MCHC: 32.9 g/dL (ref 30.0–36.0)
MCV: 74.9 fL — AB (ref 78.0–100.0)
PLATELETS: 370 10*3/uL (ref 150–400)
RBC: 4.38 MIL/uL (ref 3.87–5.11)
RDW: 17 % — ABNORMAL HIGH (ref 11.5–15.5)
WBC: 11.9 10*3/uL — ABNORMAL HIGH (ref 4.0–10.5)

## 2016-12-27 LAB — ABO/RH: ABO/RH(D): O POS

## 2016-12-27 MED ORDER — DIPHENHYDRAMINE HCL 50 MG/ML IJ SOLN: 12.5000 mg | INTRAMUSCULAR | Status: DC | PRN

## 2016-12-27 MED ORDER — FENTANYL CITRATE (PF) 100 MCG/2ML IJ SOLN
50.0000 ug | INTRAMUSCULAR | Status: DC | PRN
  Administered 2016-12-27 (×2): 100 ug via INTRAVENOUS
  Filled 2016-12-27 (×2): qty 2

## 2016-12-27 MED ORDER — SOD CITRATE-CITRIC ACID 500-334 MG/5ML PO SOLN: 30.0000 mL | ORAL | Status: DC | PRN

## 2016-12-27 MED ORDER — OXYCODONE-ACETAMINOPHEN 5-325 MG PO TABS: 2.0000 | ORAL_TABLET | ORAL | Status: DC | PRN

## 2016-12-27 MED ORDER — OXYTOCIN 40 UNITS IN LACTATED RINGERS INFUSION - SIMPLE MED
1.0000 m[IU]/min | INTRAVENOUS | Status: DC
  Administered 2016-12-27 – 2016-12-28 (×2): 2 m[IU]/min via INTRAVENOUS

## 2016-12-27 MED ORDER — PENICILLIN G POT IN DEXTROSE 60000 UNIT/ML IV SOLN
3.0000 10*6.[IU] | INTRAVENOUS | Status: DC
  Administered 2016-12-27 – 2016-12-28 (×6): 3 10*6.[IU] via INTRAVENOUS
  Filled 2016-12-27 (×10): qty 50

## 2016-12-27 MED ORDER — OXYTOCIN BOLUS FROM INFUSION
500.0000 mL | Freq: Once | INTRAVENOUS | Status: AC
  Administered 2016-12-28: 500 mL via INTRAVENOUS

## 2016-12-27 MED ORDER — HYDROXYZINE HCL 50 MG/ML IM SOLN
15.0000 mg | INTRAMUSCULAR | Status: DC | PRN
  Filled 2016-12-27: qty 0.5

## 2016-12-27 MED ORDER — FENTANYL 2.5 MCG/ML BUPIVACAINE 1/10 % EPIDURAL INFUSION (WH - ANES)
14.0000 mL/h | INTRAMUSCULAR | Status: DC | PRN
  Administered 2016-12-27 – 2016-12-28 (×2): 14 mL/h via EPIDURAL
  Filled 2016-12-27 (×2): qty 100

## 2016-12-27 MED ORDER — ACETAMINOPHEN 325 MG PO TABS: 650.0000 mg | ORAL_TABLET | ORAL | Status: DC | PRN

## 2016-12-27 MED ORDER — ONDANSETRON HCL 4 MG/2ML IJ SOLN
4.0000 mg | Freq: Four times a day (QID) | INTRAMUSCULAR | Status: DC | PRN
  Administered 2016-12-28: 4 mg via INTRAVENOUS
  Filled 2016-12-27: qty 2

## 2016-12-27 MED ORDER — LIDOCAINE HCL (PF) 1 % IJ SOLN
INTRAMUSCULAR | Status: DC | PRN
  Administered 2016-12-27 (×2): 6 mL via EPIDURAL

## 2016-12-27 MED ORDER — LACTATED RINGERS IV SOLN
500.0000 mL | Freq: Once | INTRAVENOUS | Status: AC
  Administered 2016-12-27: 500 mL via INTRAVENOUS

## 2016-12-27 MED ORDER — LACTATED RINGERS IV SOLN
500.0000 mL | INTRAVENOUS | Status: DC | PRN
  Administered 2016-12-27 (×2): 500 mL via INTRAVENOUS

## 2016-12-27 MED ORDER — LIDOCAINE HCL (PF) 1 % IJ SOLN
30.0000 mL | INTRAMUSCULAR | Status: DC | PRN
  Filled 2016-12-27: qty 30

## 2016-12-27 MED ORDER — FLEET ENEMA 7-19 GM/118ML RE ENEM: 1.0000 | ENEMA | RECTAL | Status: DC | PRN

## 2016-12-27 MED ORDER — OXYCODONE-ACETAMINOPHEN 5-325 MG PO TABS: 1.0000 | ORAL_TABLET | ORAL | Status: DC | PRN

## 2016-12-27 MED ORDER — OXYTOCIN 40 UNITS IN LACTATED RINGERS INFUSION - SIMPLE MED
2.5000 [IU]/h | INTRAVENOUS | Status: DC
  Filled 2016-12-27: qty 1000

## 2016-12-27 MED ORDER — TERBUTALINE SULFATE 1 MG/ML IJ SOLN
0.2500 mg | Freq: Once | INTRAMUSCULAR | Status: DC | PRN
  Filled 2016-12-27: qty 1

## 2016-12-27 MED ORDER — EPHEDRINE 5 MG/ML INJ
10.0000 mg | INTRAVENOUS | Status: DC | PRN
  Filled 2016-12-27 (×2): qty 4

## 2016-12-27 MED ORDER — EPHEDRINE 5 MG/ML INJ
10.0000 mg | INTRAVENOUS | Status: DC | PRN
  Filled 2016-12-27: qty 4

## 2016-12-27 MED ORDER — PHENYLEPHRINE 40 MCG/ML (10ML) SYRINGE FOR IV PUSH (FOR BLOOD PRESSURE SUPPORT)
80.0000 ug | PREFILLED_SYRINGE | INTRAVENOUS | Status: AC | PRN
  Administered 2016-12-27 (×3): 80 ug via INTRAVENOUS

## 2016-12-27 MED ORDER — PENICILLIN G POTASSIUM 5000000 UNITS IJ SOLR
5.0000 10*6.[IU] | Freq: Once | INTRAVENOUS | Status: AC
  Administered 2016-12-27: 5 10*6.[IU] via INTRAVENOUS
  Filled 2016-12-27: qty 5

## 2016-12-27 MED ORDER — LACTATED RINGERS IV SOLN
INTRAVENOUS | Status: DC
  Administered 2016-12-27: 18:00:00 via INTRAVENOUS
  Administered 2016-12-27: 125 mL via INTRAVENOUS
  Administered 2016-12-27: 22:00:00 via INTRAVENOUS

## 2016-12-27 MED ORDER — PHENYLEPHRINE 40 MCG/ML (10ML) SYRINGE FOR IV PUSH (FOR BLOOD PRESSURE SUPPORT)
80.0000 ug | PREFILLED_SYRINGE | INTRAVENOUS | Status: DC | PRN
  Filled 2016-12-27: qty 10
  Filled 2016-12-27: qty 5

## 2016-12-27 NOTE — Anesthesia Procedure Notes (Signed)
Epidural Patient location during procedure: OB Start time: 12/27/2016 9:57 PM End time: 12/27/2016 10:01 PM  Staffing Anesthesiologist: Lyn Hollingshead Performed: anesthesiologist   Preanesthetic Checklist Completed: patient identified, surgical consent, pre-op evaluation, timeout performed, IV checked, risks and benefits discussed and monitors and equipment checked  Epidural Patient position: sitting Prep: site prepped and draped and DuraPrep Patient monitoring: continuous pulse ox and blood pressure Approach: midline Location: L3-L4 Injection technique: LOR air  Needle:  Needle type: Tuohy  Needle gauge: 17 G Needle length: 9 cm and 9 Needle insertion depth: 8 cm Catheter type: closed end flexible Catheter size: 19 Gauge Catheter at skin depth: 14 cm Test dose: negative and Other  Assessment Sensory level: T9 Events: blood not aspirated, injection not painful, no injection resistance, negative IV test and no paresthesia

## 2016-12-27 NOTE — Progress Notes (Signed)
Paula Sandoval is a 25 y.o. G2P1001 at [redacted]w[redacted]d here for IOL for PD.  Subjective: Feeling cramping from low dose pitocin.  Objective: BP 107/70   Pulse 97   Temp 98.4 F (36.9 C) (Oral)   Resp 20   Ht 5\' 9"  (1.753 m)   Wt (!) 320 lb (145.2 kg)   LMP 03/15/2016 (Exact Date)   BMI 47.26 kg/m  No intake/output data recorded. No intake/output data recorded.  FHT:  FHR: 130s bpm, variability: moderate,  accelerations:  Present,  decelerations:  Absent UC:   regular, every 2 minutes SVE:   Dilation: Fingertip (Foley bulb attempted. Unsuccessful ) Effacement (%): Thick Station: -2 Exam by:: heather hogan   Labs: Lab Results  Component Value Date   WBC 11.9 (H) 12/27/2016   HGB 10.8 (L) 12/27/2016   HCT 32.8 (L) 12/27/2016   MCV 74.9 (L) 12/27/2016   PLT 370 12/27/2016    Assessment / Plan: IOL for PD.  Foley balloon placed with speculum and ring forceps.   Truett Mainland 12/27/2016, 3:03 PM

## 2016-12-27 NOTE — Progress Notes (Signed)
Patient ID: Paula Sandoval, female   DOB: 06-01-1991, 26 y.o.   MRN: [id number] Paula Sandoval is a 26 y.o. G2P1001 at [redacted]w[redacted]d admitted for induction of labor due to Post dates TOLAC. Due date 2/20.  Subjective: Uncomfortable w/ uc's, breathing through them, srom earlier, foley bulb still in. Pt wanting epidural, has had 2 doses iv fentanyl and 'only helps for 10-13mins'  Objective: BP 117/68   Pulse 99   Temp 98.3 F (36.8 C) (Oral)   Resp 19   Ht 5\' 9"  (1.753 m)   Wt (!) 145.2 kg (320 lb)   LMP 03/15/2016 (Exact Date)   BMI 47.26 kg/m  No intake/output data recorded.  FHT:  FHR: 120 bpm, variability: moderate,  accelerations:  Present,  decelerations:  Absent UC:   regular, every 2-3 minutes  SVE:   Foley bulb still in cx, pt uncomfortable w/ exam, so not able to get a good feel Pitocin @ 6 mu/min  Labs: Lab Results  Component Value Date   WBC 11.9 (H) 12/27/2016   HGB 10.8 (L) 12/27/2016   HCT 32.8 (L) 12/27/2016   MCV 74.9 (L) 12/27/2016   PLT 370 12/27/2016    Assessment / Plan: IOL d/t postdates, TOLAC, uncomfortable w/ uc's, srom @ 1900, foley bulb still in- wanting epidural  Labor: Progressing normally Fetal Wellbeing:  Category I Pain Control:  requests epidural Pre-eclampsia: n/a I/D:  pcn for gbs+ Anticipated MOD:  VBAC  Tawnya Crook CNM, WHNP-BC 12/27/2016, 9:41 PM

## 2016-12-27 NOTE — Progress Notes (Signed)
Patient ID: Paula Sandoval, female   DOB: 04/07/1991, 26 y.o.   MRN: [id number] Admitted for IOL of labor 2/2 post-dates Cervix: FT/thick/posterior Attempted to place foley balloon, unable at this time.  C/W Dr. Nehemiah Settle, will start pitocin 2x2 and hold at 6. He will attempt to place foley.   Mathis Bud  10:53 AM 12/27/16

## 2016-12-27 NOTE — H&P (Signed)
LABOR ADMISSION HISTORY AND PHYSICAL  Paula Sandoval is a 26 y.o. female G2P1001 with IUP at [redacted]w[redacted]d by LMP cw 12 wk sono presenting for IOL 2/2 to PD. Desires TOLAC. Previous CS for non-reassuring FHT. She reports +FMs, No LOF, no VB, no blurry vision, headaches or peripheral edema, and RUQ pain.  She plans on breast feeding. She is undecided regarding birth control.  Dating: By LMP cw 12 wk sono --->  Estimated Date of Delivery: 12/20/16  Prenatal History/Complications: Morbid Obesity  Previous CS for Non-reassuring FHT   Past Medical History: Past Medical History:  Diagnosis Date  . Asthma   . Tumors 2015   of leg, benign    Past Surgical History: Past Surgical History:  Procedure Laterality Date  . CESAREAN SECTION  02/22/2012   Procedure: CESAREAN SECTION;  Surgeon: Frederico Hamman, MD;  Location: [facility];  Service: Gynecology;  Laterality: N/A;  . excision of tumor right leg  2009    Obstetrical History: OB History    Gravida Para Term Preterm AB Living   2 1 1     1    SAB TAB Ectopic Multiple Live Births           1      Social History: Social History   Social History  . Marital status: Single    Spouse name: N/A  . Number of children: N/A  . Years of education: N/A   Social History Main Topics  . Smoking status: Former Smoker    Types: Cigarettes    Quit date: 2008  . Smokeless tobacco: Never Used  . Alcohol use No  . Drug use: No  . Sexual activity: Yes    Birth control/ protection: None     Comment: occasional condom use   Other Topics Concern  . None   Social History Narrative  . None    Family History: Family History  Problem Relation Age of Onset  . Anesthesia problems Neg Hx   . Hypotension Neg Hx   . Malignant hyperthermia Neg Hx   . Pseudochol deficiency Neg Hx     Allergies: No Known Allergies  Prescriptions Prior to Admission  Medication Sig Dispense Refill Last Dose  . Prenatal Vit-Fe Fumarate-FA (PRENATAL MULTIVITAMIN)  TABS tablet Take 1 tablet by mouth daily at 12 noon.   Taking     Review of Systems   All systems reviewed and negative except as stated in HPI  Blood pressure 107/73, pulse (!) 116, temperature 98.2 F (36.8 C), temperature source Oral, height 5\' 9"  (1.753 m), weight (!) 145.2 kg (320 lb), last menstrual period 03/15/2016. General appearance: alert and cooperative Lungs: clear to auscultation bilaterally Heart: regular rate and rhythm Abdomen: soft, non-tender; bowel sounds normal Extremities: Homans sign is negative, no sign of DVT Presentation: cephalic Fetal monitoringBaseline: 120 bpm, Variability: Good {> 6 bpm), Accelerations: Reactive and Decelerations: Absent Uterine activity: Occasional     Prenatal labs: ABO, Rh: O/POS/-- (07/27 1425) Antibody: NEG (07/27 1425) Rubella: !Error! RPR: NON REAC (12/07 0949)  HBsAg: NEGATIVE (07/27 1425)  HIV: NONREACTIVE (12/07 0949)  GBS: Positive (01/16 0000)  1 hr early Glucola:118  2 hr Glucola  77/127/105  Genetic screening  Normal  Anatomy US: Normal    Clinic  Home Prenatal Labs  Dating LMP/12 Blood type: O/POS/-- (07/27 1425)   Genetic Screen 1 Screen: Nml  AFP: Declined  Quad:     NIPS: Antibody:NEG (07/27 1425)  Anatomic US wnl female  Rubella: 7.76 (07/27 1425)  GTT Early:               Third trimester: Nml RPR: NON REAC (07/27 1425)   Flu vaccine 07/27/16 HBsAg: NEGATIVE (07/27 1425)   TDaP vaccine   declined                                          Rhogam:NA HIV: NONREACTIVE (07/27 1425)   Baby Food   breast                                 GBS: Positive (For PCN allergy, check sensitivities)  Contraception  undecided Pap: 05/26/16  Circumcision NA   Pediatrician  [facility]   Support Person Welton Flakes FOB       Prenatal Transfer Tool  Maternal Diabetes: No Genetic Screening: Normal Maternal Ultrasounds/Referrals: Normal Fetal Ultrasounds or other Referrals:  None Maternal Substance Abuse:   No Significant Maternal Medications:  None Significant Maternal Lab Results: Lab values include: Group B Strep positive  No results found for this or any previous visit (from the past 24 hour(s)).  Patient Active Problem List   Diagnosis Date Noted  . Group B Streptococcus carrier, antepartum 11/17/2016  . Previous cesarean delivery, antepartum condition or complication [id number]  . Supervision of low-risk pregnancy 05/26/2016  . Morbid obesity with BMI of 45.0-49.9, adult ([place]) 05/26/2016    Assessment: Paula Sandoval is a 26 y.o. G2P1001 at [redacted]w[redacted]d here for IOL 2/2 postdates. Desires TOLAC.   #Labor: Will attempt FB. No cytotec due to previous CS.  #Pain: Epidural upon request  #FWB: Cat I  #ID:  GBS+, PCN prophylaxis  #MOF: Breast #MOC:Undecided  #Circ:  N/A   Melina Schools 12/27/2016, 8:59 AM  CNM attestation:  I have seen and examined this patient; I agree with above documentation in the resident's note.   Paula Sandoval is a 26 y.o. G2P1001 at [redacted]w[redacted]d here for IOL 2/2 post-dates  +FM, denies LOF, VB, contractions, vaginal discharge.  PE: BP (!) 102/58   Pulse 91   Temp 98.2 F (36.8 C) (Oral)   Resp 20   Ht 5\' 9"  (1.753 m)   Wt (!) 320 lb (145.2 kg)   LMP 03/15/2016 (Exact Date)   BMI 47.26 kg/m  Gen: calm comfortable, NAD Resp: normal effort, no distress Abd: gravid  ROS, labs, PMH reviewed NST Category 1  Plan: Admit to labor and delivery  Plan for foley balloon 2/2 history of c-section.   Mathis Bud, CNM 10:54 AM

## 2016-12-27 NOTE — Progress Notes (Signed)
Paula Sandoval is a 26 y.o. G2P1001 at [redacted]w[redacted]d here for IOL 2/2 postdates.  Subjective: Reports feeling ctx. Requesting pain medication.   Objective: BP 108/73   Pulse 100   Temp 98.2 F (36.8 C) (Oral)   Resp 18   Ht 5\' 9"  (1.753 m)   Wt (!) 145.2 kg (320 lb)   LMP 03/15/2016 (Exact Date)   BMI 47.26 kg/m    FHT:  FHR: 130 bpm, variability: good,  accelerations:  15x15,  decelerations:  One variable  UC:   Q 2-5 minutes   Dilation: Fingertip Effacement (%): Thick Cervical Position: Posterior Station: -2 Presentation: Vertex Exam by:: heather hogan   Labs: Results for orders placed or performed during the hospital encounter of 12/27/16 (from the past 24 hour(s))  CBC     Status: Abnormal   Collection Time: 12/27/16  9:00 AM  Result Value Ref Range   WBC 11.9 (H) 4.0 - 10.5 K/uL   RBC 4.38 3.87 - 5.11 MIL/uL   Hemoglobin 10.8 (L) 12.0 - 15.0 g/dL   HCT 32.8 (L) 36.0 - 46.0 %   MCV 74.9 (L) 78.0 - 100.0 fL   MCH 24.7 (L) 26.0 - 34.0 pg   MCHC 32.9 30.0 - 36.0 g/dL   RDW 17.0 (H) 11.5 - 15.5 %   Platelets 370 150 - 400 K/uL  Type and screen [place]     Status: None   Collection Time: 12/27/16  9:00 AM  Result Value Ref Range   ABO/RH(D) O POS    Antibody Screen NEG    Sample Expiration 12/30/2016   ABO/Rh     Status: None   Collection Time: 12/27/16  9:00 AM  Result Value Ref Range   ABO/RH(D) O POS     Assessment / Plan: [redacted]w[redacted]d week IUP Labor: FB in place since 1500. Augmenting with Pitocin at 6.  Fetal Wellbeing:  Category I Pain Control:  IV Fentanyl  Anticipated MOD:  SVD   Phill Myron, D.O. 12/27/2016, 6:50 PM PGY-2, [place]

## 2016-12-27 NOTE — Anesthesia Pain Management Evaluation Note (Signed)
  CRNA Pain Management Visit Note  Patient: Paula Sandoval, 26 y.o., female  "Hello I am a member of the anesthesia team at [facility]. We have an anesthesia team available at all times to provide care throughout the hospital, including epidural management and anesthesia for C-section. I don't know your plan for the delivery whether it a natural birth, water birth, IV sedation, nitrous supplementation, doula or epidural, but we want to meet your pain goals."   1.Was your pain managed to your expectations on prior hospitalizations?   Yes   2.What is your expectation for pain management during this hospitalization?     Epidural  3.How can we help you reach that goal? epidural  Record the patient's initial score and the patient's pain goal.   Pain: 0  Pain Goal: 4 [facility] wants you to be able to say your pain was always managed very well.  Paula Sandoval 12/27/2016

## 2016-12-27 NOTE — Anesthesia Preprocedure Evaluation (Signed)
Anesthesia Evaluation  Patient identified by MRN, date of birth, ID band Patient awake    Reviewed: Allergy & Precautions, H&P , Patient's Chart, lab work & pertinent test results  Airway Mallampati: II  TM Distance: >3 FB Neck ROM: full    Dental no notable dental hx.    Pulmonary former smoker,    Pulmonary exam normal breath sounds clear to auscultation       Cardiovascular negative cardio ROS Normal cardiovascular exam     Neuro/Psych negative neurological ROS  negative psych ROS   GI/Hepatic negative GI ROS, Neg liver ROS,   Endo/Other  Morbid obesity  Renal/GU negative Renal ROS     Musculoskeletal   Abdominal (+) + obese,   Peds  Hematology negative hematology ROS (+)   Anesthesia Other Findings   Reproductive/Obstetrics (+) Pregnancy                             Anesthesia Physical Anesthesia Plan  ASA: III  Anesthesia Plan: Epidural   Post-op Pain Management:    Induction:   Airway Management Planned:   Additional Equipment:   Intra-op Plan:   Post-operative Plan:   Informed Consent: I have reviewed the patients History and Physical, chart, labs and discussed the procedure including the risks, benefits and alternatives for the proposed anesthesia with the patient or authorized representative who has indicated his/her understanding and acceptance.     Plan Discussed with:   Anesthesia Plan Comments:         Anesthesia Quick Evaluation

## 2016-12-28 ENCOUNTER — Encounter (HOSPITAL_COMMUNITY): Payer: Self-pay

## 2016-12-28 DIAGNOSIS — Z3A41 41 weeks gestation of pregnancy: Secondary | ICD-10-CM

## 2016-12-28 DIAGNOSIS — O99824 Streptococcus B carrier state complicating childbirth: Secondary | ICD-10-CM

## 2016-12-28 DIAGNOSIS — O480 Post-term pregnancy: Secondary | ICD-10-CM

## 2016-12-28 LAB — RPR: RPR: NONREACTIVE

## 2016-12-28 MED ORDER — TETANUS-DIPHTH-ACELL PERTUSSIS 5-2.5-18.5 LF-MCG/0.5 IM SUSP: 0.5000 mL | Freq: Once | INTRAMUSCULAR | Status: DC

## 2016-12-28 MED ORDER — BENZOCAINE-MENTHOL 20-0.5 % EX AERO
1.0000 "application " | INHALATION_SPRAY | CUTANEOUS | Status: DC | PRN
  Administered 2016-12-28: 1 via TOPICAL
  Filled 2016-12-28: qty 56

## 2016-12-28 MED ORDER — ACETAMINOPHEN 325 MG PO TABS
650.0000 mg | ORAL_TABLET | ORAL | Status: DC | PRN
  Administered 2016-12-28 – 2016-12-29 (×2): 650 mg via ORAL
  Filled 2016-12-28 (×2): qty 2

## 2016-12-28 MED ORDER — ONDANSETRON HCL 4 MG/2ML IJ SOLN: 4.0000 mg | INTRAMUSCULAR | Status: DC | PRN

## 2016-12-28 MED ORDER — COCONUT OIL OIL: 1.0000 "application " | TOPICAL_OIL | Status: DC | PRN

## 2016-12-28 MED ORDER — DIBUCAINE 1 % RE OINT
1.0000 "application " | TOPICAL_OINTMENT | RECTAL | Status: DC | PRN
  Administered 2016-12-28: 1 via RECTAL
  Filled 2016-12-28: qty 28

## 2016-12-28 MED ORDER — DIPHENHYDRAMINE HCL 25 MG PO CAPS: 25.0000 mg | ORAL_CAPSULE | Freq: Four times a day (QID) | ORAL | Status: DC | PRN

## 2016-12-28 MED ORDER — IBUPROFEN 600 MG PO TABS
600.0000 mg | ORAL_TABLET | Freq: Four times a day (QID) | ORAL | Status: DC
  Administered 2016-12-28 – 2016-12-29 (×4): 600 mg via ORAL
  Filled 2016-12-28 (×4): qty 1

## 2016-12-28 MED ORDER — WITCH HAZEL-GLYCERIN EX PADS
1.0000 "application " | MEDICATED_PAD | CUTANEOUS | Status: DC | PRN
  Administered 2016-12-28: 1 via TOPICAL

## 2016-12-28 MED ORDER — PRENATAL MULTIVITAMIN CH
1.0000 | ORAL_TABLET | Freq: Every day | ORAL | Status: DC
  Administered 2016-12-29: 1 via ORAL
  Filled 2016-12-28: qty 1

## 2016-12-28 MED ORDER — SIMETHICONE 80 MG PO CHEW
80.0000 mg | CHEWABLE_TABLET | ORAL | Status: DC | PRN
  Filled 2016-12-28: qty 1

## 2016-12-28 MED ORDER — ZOLPIDEM TARTRATE 5 MG PO TABS: 5.0000 mg | ORAL_TABLET | Freq: Every evening | ORAL | Status: DC | PRN

## 2016-12-28 MED ORDER — SENNOSIDES-DOCUSATE SODIUM 8.6-50 MG PO TABS
2.0000 | ORAL_TABLET | ORAL | Status: DC
  Administered 2016-12-28: 2 via ORAL
  Filled 2016-12-28: qty 2

## 2016-12-28 MED ORDER — ONDANSETRON HCL 4 MG PO TABS: 4.0000 mg | ORAL_TABLET | ORAL | Status: DC | PRN

## 2016-12-28 NOTE — Progress Notes (Addendum)
Patient ID: Paula Sandoval, female   DOB: 06/18/1991, 26 y.o.   MRN: [id number] Paula Sandoval is a 26 y.o. G2P1001 at [redacted]w[redacted]d admitted for induction of labor/TOLAC due to Post dates. Due date 2/20.  Subjective: Pt doing well w/o complaints  Objective: BP 110/70   Pulse 97   Temp 97.2 F (36.2 C) (Axillary)   Resp 18   Ht 5\' 9"  (1.753 m)   Wt (!) 145.2 kg (320 lb)   LMP 03/15/2016 (Exact Date)   SpO2 97%   BMI 47.26 kg/m  No intake/output data recorded.  FHT:  FHR: 125 bpm, variability: moderate,  accelerations:  Present,  decelerations:  Present variables, late in timing UC:   q 2-64mins  SVE:   Dilation: 5 Effacement (%): 80 Station: -2 Exam by:: Laren Orama, CNM  IUPC placed w/o difficulty  Pitocin @ 2 mu/min  Labs: Lab Results  Component Value Date   WBC 11.9 (H) 12/27/2016   HGB 10.8 (L) 12/27/2016   HCT 32.8 (L) 12/27/2016   MCV 74.9 (L) 12/27/2016   PLT 370 12/27/2016    Assessment / Plan: IOL d/t postdates, TOLAC, RN unable to titrate pit d/t decels- will improve briefly w/ interventions. IUPC placed, will amnioinfuse 355ml bolus then 140ml/hr. Discussed w/ Dr. Nehemiah Settle  Labor: early Fetal Wellbeing:  Category II Pain Control:  Epidural Pre-eclampsia: n/a I/D:  pcn for gbs+ Anticipated MOD:  VBAC  Tawnya Crook CNM, WHNP-BC 12/28/2016, 0530

## 2016-12-28 NOTE — Anesthesia Postprocedure Evaluation (Signed)
Anesthesia Post Note  Patient: Paula Sandoval  Procedure(s) Performed: * No procedures listed *  Patient location during evaluation: Mother Baby Anesthesia Type: Epidural Level of consciousness: awake and alert and oriented Pain management: pain level controlled Vital Signs Assessment: post-procedure vital signs reviewed and stable Respiratory status: spontaneous breathing and nonlabored ventilation Cardiovascular status: stable Postop Assessment: no headache, patient able to bend at knees, no backache, no signs of nausea or vomiting, epidural receding and adequate PO intake Anesthetic complications: no        Last Vitals:  Vitals:   12/28/16 1340 12/28/16 1440  BP: 108/62 120/69  Pulse: 100 93  Resp: 18 18  Temp: 36.8 C 37.1 C    Last Pain:  Vitals:   12/28/16 1440  TempSrc: Oral  PainSc: 0-No pain   Pain Goal:                 [employer]

## 2016-12-28 NOTE — Progress Notes (Signed)
Patient ID: [name], female   DOB: Aug 18, 1991, 26 y.o.   MRN: [id number] Paula Sandoval is a 26 y.o. G2P1001 at [redacted]w[redacted]d admitted for induction of labor/TOLAC due to Post dates. Due date 2/20.  Subjective: Doing well, comfortable w/ epidural, no complaints  Objective: BP (!) 108/56   Pulse 98   Temp 98.4 F (36.9 C) (Oral)   Resp 16   Ht 5\' 9"  (1.753 m)   Wt (!) 145.2 kg (320 lb)   LMP 03/15/2016 (Exact Date)   SpO2 97%   BMI 47.26 kg/m  No intake/output data recorded.  FHT:  FHR: 125 bpm, variability: moderate,  accelerations:  Present,  decelerations:  Absent UC:   q 4-68mins  SVE:   Dilation: 5 Effacement (%): 80 Station: -2 Exam by:: [email or web address], cnm  Foley bulb sitting in vagina, removed  Pitocin @ 0 mu/min  Labs: Lab Results  Component Value Date   WBC 11.9 (H) 12/27/2016   HGB 10.8 (L) 12/27/2016   HCT 32.8 (L) 12/27/2016   MCV 74.9 (L) 12/27/2016   PLT 370 12/27/2016    Assessment / Plan: IOL/TOLAC d/t postdates, had stopped pitocin earlier after epidural d/t decels, Cat 1 since. Foley bulb now out, resume pitocin per protocol   Labor: Progressing normally Fetal Wellbeing:  Category I Pain Control:  Epidural Pre-eclampsia: n/a I/D:  pcn for gbs+ Anticipated MOD:  VBAC  Paula Sandoval CNM, WHNP-BC 12/28/2016, 3:00 AM

## 2016-12-28 NOTE — Lactation Note (Signed)
Lactation Consultation Note  Patient Name: Paula Sandoval Today's Date: 12/28/2016 Reason for consult: Initial assessment   Initial assessment with mom of < 1 hour old in [place]. Mom reports she BF her 26 yo very briefly and had difficulty with sore nipples so she stopped early. She reports she plans to BF this infant for 9 months.   Mom reports infant fed for 15 minutes before I arrived. Infant showing feeding cues. Assisted mom in latching infant to right breast in the cross cradle hold. Infant latched on easily using the teacup hold. Mom with large compressible breasts and areola with short shaft everted nipples. Nipple elongates after infant comes off breast and is rounded. Showed mom how to hand express and large gtts colostrum easily expressible. Mom reports + breast changes with pregnancy.   Enc mom to feed infant STS 8-12 x in 24 hours at first feeding cues. Enc mom to call out for assistance as needed. Discussed colostrum, infant stomach size, and milk coming to volume. Discussed positioning and using pillow and head support with feeding. Mom did well with cross cradle hold. Feeding log given with instructions for use.  BF Resources Handout and [place] Brochure given, mom informed of IP/OP Services, BF Support Groups and [place] phone #. Mom is a [facility] Client and is aware to call and make appt post d/c. Mom has a Medela pump at home. Mom and dad without further questions/concerns at this time. Mom pleased infant latched easily to breast after delivery.    Maternal Data Formula Feeding for Exclusion: No Has patient been taught Hand Expression?: Yes Does the patient have breastfeeding experience prior to this delivery?: Yes  Feeding Feeding Type: Breast Fed Length of feed: 10 min (Still feeding when I left the room)  LATCH Score/Interventions Latch: Grasps breast easily, tongue down, lips flanged, rhythmical sucking.  Audible Swallowing: Spontaneous and intermittent  Type of Nipple:  Everted at rest and after stimulation  Comfort (Breast/Nipple): Soft / non-tender     Hold (Positioning): Assistance needed to correctly position infant at breast and maintain latch. Intervention(s): Breastfeeding basics reviewed;Support Pillows;Position options;Skin to skin  LATCH Score: 9  Lactation Tools Discussed/Used WIC Program: Yes   Consult Status Consult Status: Follow-up Date: 12/29/16 Follow-up type: In-patient    Debby Freiberg Hice 12/28/2016, 12:32 PM

## 2016-12-29 MED ORDER — NORETHINDRONE 0.35 MG PO TABS: 1.0000 | ORAL_TABLET | Freq: Every day | ORAL | 11 refills | Status: DC

## 2016-12-29 MED ORDER — IBUPROFEN 600 MG PO TABS: 600.0000 mg | ORAL_TABLET | Freq: Four times a day (QID) | ORAL | 0 refills | Status: DC

## 2016-12-29 MED ORDER — PRENATAL MULTIVITAMIN CH: 1.0000 | ORAL_TABLET | Freq: Every day | ORAL | 1 refills | Status: AC

## 2016-12-29 NOTE — Discharge Summary (Addendum)
Obstetric Discharge Summary Reason for Admission: IOL at 41 weeks for PD, TOLAC, used foley and pitocin Prenatal Procedures: NST and ultrasound Intrapartum Procedures: spontaneous vaginal delivery Postpartum Procedures: none Complications-Operative and Postpartum: 2nd degree perineal laceration Hemoglobin  Date Value Ref Range Status  12/27/2016 10.8 (L) 12.0 - 15.0 g/dL Final   HCT  Date Value Ref Range Status  12/27/2016 32.8 (L) 36.0 - 46.0 % Final    Physical Exam:  General: 2nd degree perineal laceration Lochia: appropriate Uterine Fundus: firm DVT Evaluation: No evidence of DVT seen on physical exam.  Discharge Diagnoses: Term Pregnancy-delivered  Discharge Information: Date: 12/29/2016 Activity: pelvic rest Diet: routine Medications: IBU and PNV She plans an IUD at postpartum visit. Condition: stable Instructions: refer to practice specific booklet Discharge to: home Follow-up [place]. Schedule an appointment as soon as possible for a visit in 6 week(s).   Specialty:  Obstetrics and Gynecology Contact information: [address], [place] [phone]          Newborn Data: Live born female  Birth Weight: 8 lb 10.3 oz (3920 g) APGAR: 9, [place] 12/29/2016, 7:32 AM

## 2016-12-29 NOTE — Lactation Note (Signed)
This note was copied from a baby's chart. Lactation Consultation Note  Mother reports that BF is going well and that she had just finished feeding. Reviewed frequency of feedings and output expectations. Baby is currently latching with a nipple shield. Stressed the need to monitor baby's weight weekly to ensure transfer.  Encouraged support groups and OP services as needed and informed mom that baby could be weighed at support group.  Patient Name: Paula Sandoval Today's Date: 12/29/2016 Reason for consult: Follow-up assessment   Maternal Data Has patient been taught Hand Expression?: Yes Does the patient have breastfeeding experience prior to this delivery?: Yes  Feeding Feeding Type: Breast Fed Length of feed: 20 min  LATCH Score/Interventions                      Lactation Tools Discussed/Used Tools: Nipple Shields Nipple shield size: 24   Consult Status Consult Status: Complete    Van Clines 12/29/2016, 11:40 AM

## 2016-12-29 NOTE — Discharge Instructions (Signed)

## 2017-02-01 ENCOUNTER — Ambulatory Visit (INDEPENDENT_AMBULATORY_CARE_PROVIDER_SITE_OTHER): Payer: BLUE CROSS/BLUE SHIELD | Admitting: Obstetrics and Gynecology

## 2017-02-01 ENCOUNTER — Encounter: Payer: Self-pay | Admitting: Obstetrics and Gynecology

## 2017-02-01 DIAGNOSIS — Z3049 Encounter for surveillance of other contraceptives: Secondary | ICD-10-CM | POA: Diagnosis not present

## 2017-02-01 DIAGNOSIS — Z30017 Encounter for initial prescription of implantable subdermal contraceptive: Secondary | ICD-10-CM

## 2017-02-01 DIAGNOSIS — Z3202 Encounter for pregnancy test, result negative: Secondary | ICD-10-CM | POA: Diagnosis not present

## 2017-02-01 LAB — POCT PREGNANCY, URINE: Preg Test, Ur: NEGATIVE

## 2017-02-01 MED ORDER — ETONOGESTREL 68 MG ~~LOC~~ IMPL
68.0000 mg | DRUG_IMPLANT | Freq: Once | SUBCUTANEOUS | Status: AC
  Administered 2017-02-01: 68 mg via SUBCUTANEOUS

## 2017-02-01 NOTE — Progress Notes (Signed)
Subjective:     Paula Sandoval is a 26 y.o. female who presents for a postpartum visit. She is 5 weeks postpartum following a spontaneous vaginal delivery. I have fully reviewed the prenatal and intrapartum course. The delivery was a TOLAC at 41  gestational weeks. Outcome: spontaneous vaginal delivery. Anesthesia: epidural. Postpartum course has been uncomplicated. Baby's course has been uncomplicated. Baby is feeding by both breast and bottle - Similac Soy. Bleeding no bleeding. Bowel function is normal. Bladder function is normal. Patient is not sexually active. Contraception method is abstinence Postpartum depression screening: negative.    Review of Systems Pertinent items are noted in HPI.   Objective:    Wt (!) 304 lb 3.2 oz (138 kg)   Breastfeeding? Yes   BMI 44.92 kg/m   General:  alert, cooperative and no distress   Breasts:  inspection negative, no nipple discharge or bleeding, no masses or nodularity palpable  Lungs: clear to auscultation bilaterally  Heart:  regular rate and rhythm, S1, S2 normal, no murmur, click, rub or gallop  Abdomen: soft, non-tender; bowel sounds normal; no masses,  no organomegaly   Vulva:  incision well healed  Vagina: ; good tone and support  Cervix:  not examined  Corpus: normal size, contour, position, consistency, mobility, non-tender  Adnexa:  no mass, fullness, tenderness  Rectal Exam: Not performed.        Assessment:     5 wks postpartum exam. Pap smear not done at today's visit.   Plan:    1. Contraception: Nexplanon 2. Continue PN vitamin for 1 week 3. Follow up in: 6 months or as needed.    Procedure Note: Nexplanon Counseled on contraception options, indications, duration of action and risks, including abnormal bleeding pattern, infection, bleeding at site.  Left upper arm inner aspect site skin prepped with alcohol swab, infiltrated with 1% xylocaine with epinephrine. Area swabbed with Betadine and Nexplanon device inserted in  usual fashion. Patient tolerated procedure well and she palpated rod below skin. Minimal bleeding. Pressure dressing applied.  Complications; none Condtion: stable  Lorene Dy, CNM 02/01/2017 10:59 AM

## 2017-06-28 ENCOUNTER — Encounter: Payer: Self-pay | Admitting: Obstetrics and Gynecology

## 2018-03-21 ENCOUNTER — Telehealth: Payer: Self-pay

## 2018-03-21 NOTE — Telephone Encounter (Signed)
Pt called requesting to have her Nexplanon removed and she was wants to start the pill.

## 2018-03-23 NOTE — Telephone Encounter (Signed)
Sent patient mychart message to call and schedule appointment to have nexplanon removed and start other birth control.

## 2018-04-18 ENCOUNTER — Ambulatory Visit (INDEPENDENT_AMBULATORY_CARE_PROVIDER_SITE_OTHER): Payer: BLUE CROSS/BLUE SHIELD | Admitting: Advanced Practice Midwife

## 2018-04-18 ENCOUNTER — Encounter: Payer: Self-pay | Admitting: Advanced Practice Midwife

## 2018-04-18 VITALS — BP 112/73 | HR 82 | Ht 69.0 in | Wt 339.2 lb

## 2018-04-18 DIAGNOSIS — Z978 Presence of other specified devices: Secondary | ICD-10-CM

## 2018-04-18 DIAGNOSIS — Z975 Presence of (intrauterine) contraceptive device: Principal | ICD-10-CM

## 2018-04-18 DIAGNOSIS — N921 Excessive and frequent menstruation with irregular cycle: Secondary | ICD-10-CM

## 2018-04-18 MED ORDER — NORGESTIMATE-ETH ESTRADIOL 0.25-35 MG-MCG PO TABS: 1.0000 | ORAL_TABLET | Freq: Every day | ORAL | 11 refills | Status: DC

## 2018-04-18 NOTE — Patient Instructions (Signed)
Oral Contraception Information Oral contraceptive pills (OCPs) are medicines taken to prevent pregnancy. OCPs work by preventing the ovaries from releasing eggs. The hormones in OCPs also cause the cervical mucus to thicken, preventing the sperm from entering the uterus. The hormones also cause the uterine lining to become thin, not allowing a fertilized egg to attach to the inside of the uterus. OCPs are highly effective when taken exactly as prescribed. However, OCPs do not prevent sexually transmitted diseases (STDs). Safe sex practices, such as using condoms along with the pill, can help prevent STDs. Before taking the pill, you may have a physical exam and Pap test. Your health care provider may order blood tests. The health care provider will make sure you are a good candidate for oral contraception. Discuss with your health care provider the possible side effects of the OCP you may be prescribed. When starting an OCP, it can take 2 to 3 months for the body to adjust to the changes in hormone levels in your body. Types of oral contraception  The combination pill-This pill contains estrogen and progestin (synthetic progesterone) hormones. The combination pill comes in 21-day, 28-day, or 91-day packs. Some types of combination pills are meant to be taken continuously (365-day pills). With 21-day packs, you do not take pills for 7 days after the last pill. With 28-day packs, the pill is taken every day. The last 7 pills are without hormones. Certain types of pills have more than 21 hormone-containing pills. With 91-day packs, the first 84 pills contain both hormones, and the last 7 pills contain no hormones or contain estrogen only.  The minipill-This pill contains the progesterone hormone only. The pill is taken every day continuously. It is very important to take the pill at the same time each day. The minipill comes in packs of 28 pills. All 28 pills contain the hormone. Advantages of oral  contraceptive pills  Decreases premenstrual symptoms.  Treats menstrual period cramps.  Regulates the menstrual cycle.  Decreases a heavy menstrual flow.  May treatacne, depending on the type of pill.  Treats abnormal uterine bleeding.  Treats polycystic ovarian syndrome.  Treats endometriosis.  Can be used as emergency contraception. Things that can make oral contraceptive pills less effective OCPs can be less effective if:  You forget to take the pill at the same time every day.  You have a stomach or intestinal disease that lessens the absorption of the pill.  You take OCPs with other medicines that make OCPs less effective, such as antibiotics, certain HIV medicines, and some seizure medicines.  You take expired OCPs.  You forget to restart the pill on day 7, when using the packs of 21 pills.  Risks associated with oral contraceptive pills Oral contraceptive pills can sometimes cause side effects, such as:  Headache.  Nausea.  Breast tenderness.  Irregular bleeding or spotting.  Combination pills are also associated with a small increased risk of:  Blood clots.  Heart attack.  Stroke.  This information is not intended to replace advice given to you by your health care provider. Make sure you discuss any questions you have with your health care provider. Document Released: 01/07/2003 Document Revised: 03/24/2016 Document Reviewed: 04/07/2013 Elsevier Interactive Patient Education  2018 [employer]  

## 2018-04-18 NOTE — Progress Notes (Signed)
  Subjective:     Patient ID: Paula Sandoval, female   DOB: 05-29-1991, 27 y.o.   MRN: [id number]  Paula Sandoval is a 27 y.o. [id number] who is here today for Nexplanon removal. She had Nexplanon placed on 02/01/2017. She reports that she has had irregular bleeding with this. She states that from 02/17/18- 03/19/18. She had bleeding. She states that it started light, and then became heavy, and then went back to being light again.   11/08/17-12/21/17 had bleeding  01/04/18-01/26/18 had bleeding  She states that she has tried herbal teas and midol, but nothing has helped.     Review of Systems  All other systems reviewed and are negative.      Objective:   Physical Exam  Constitutional: She is oriented to person, place, and time. She appears well-developed and well-nourished. No distress.  HENT:  Head: Normocephalic.  Pulmonary/Chest: Effort normal.  Neurological: She is alert and oriented to person, place, and time.  Psychiatric: She has a normal mood and affect.  Nursing note and vitals reviewed.      Assessment:     1. Breakthrough bleeding on Nexplanon        Plan:     Discussed with patient options including removal, and starting OCPS or starting OCPs now with Nexplanon in to see how she does with taking pills and if it controls bleeding. She is not interested in a pregnancy at this time. She would like to OCPs at this time to see if this controls the bleeding.   Return in about 3-4 months to see how she is doing  RX: sprintec 28 #1 with 11RF

## 2018-06-14 ENCOUNTER — Ambulatory Visit (INDEPENDENT_AMBULATORY_CARE_PROVIDER_SITE_OTHER): Payer: BLUE CROSS/BLUE SHIELD | Admitting: Family Medicine

## 2018-06-14 ENCOUNTER — Encounter: Payer: Self-pay | Admitting: Family Medicine

## 2018-06-14 VITALS — BP 118/69 | HR 106 | Ht 73.0 in | Wt 340.0 lb

## 2018-06-14 DIAGNOSIS — Z3046 Encounter for surveillance of implantable subdermal contraceptive: Secondary | ICD-10-CM

## 2018-06-14 NOTE — Progress Notes (Signed)
  Nexplanon Removal:  Patient given informed consent for removal of her Implanon, time out was performed.  Signed copy in the chart.  Appropriate time out taken. Implanon site identified.  Area prepped in usual sterile fashon. One cc of 1% lidocaine was used to anesthetize the area at the distal end of the implant. A small stab incision was made right beside the implant on the distal portion.  The implanon rod was grasped using hemostats and removed without difficulty.  There was less than 3 cc blood loss. There were no complications.  A small amount of antibiotic ointment and steri-strips were applied over the small incision.  A pressure bandage was applied to reduce any bruising.  The patient tolerated the procedure well and was given post procedure instructions. 

## 2018-07-12 ENCOUNTER — Ambulatory Visit: Payer: BLUE CROSS/BLUE SHIELD | Admitting: Family Medicine

## 2018-08-09 ENCOUNTER — Ambulatory Visit: Payer: BLUE CROSS/BLUE SHIELD

## 2018-08-09 LAB — POCT PREGNANCY, URINE: Preg Test, Ur: NEGATIVE

## 2018-10-11 ENCOUNTER — Ambulatory Visit (INDEPENDENT_AMBULATORY_CARE_PROVIDER_SITE_OTHER): Payer: BLUE CROSS/BLUE SHIELD | Admitting: *Deleted

## 2018-10-11 DIAGNOSIS — Z3201 Encounter for pregnancy test, result positive: Secondary | ICD-10-CM

## 2018-10-11 DIAGNOSIS — Z3200 Encounter for pregnancy test, result unknown: Secondary | ICD-10-CM

## 2018-10-11 DIAGNOSIS — O3680X0 Pregnancy with inconclusive fetal viability, not applicable or unspecified: Secondary | ICD-10-CM

## 2018-10-11 LAB — POCT URINE PREGNANCY: Preg Test, Ur: POSITIVE — AB

## 2018-10-11 NOTE — Progress Notes (Signed)
I have reviewed this chart and agree with the RN/CMA assessment and management.    K. Meryl Davis, M.D. Attending [facility] (Faculty Practice)   

## 2018-10-11 NOTE — Progress Notes (Signed)
Paula Sandoval presents today for UPT. She complains of nausea with vomiting. LMP:06/10/18    OBJECTIVE: Appears well, in no apparent distress.  OB History    Gravida  2   Para  2   Term  2   Preterm      AB      Living  2     SAB      TAB      Ectopic      Multiple  0   Live Births  2          Home UPT Result: Positive In-Office UPT result:Positive  ASSESSMENT: Positive pregnancy test Dating u/s ordered today, per Dr Rosana Hoes.   PLAN Prenatal care to be completed at: [facility] Pt made aware she may take Unisom and B6 at night to help with N&V.

## 2018-10-15 ENCOUNTER — Encounter (HOSPITAL_COMMUNITY): Payer: Self-pay | Admitting: *Deleted

## 2018-10-17 ENCOUNTER — Other Ambulatory Visit: Payer: Self-pay | Admitting: Obstetrics and Gynecology

## 2018-10-17 ENCOUNTER — Encounter (HOSPITAL_COMMUNITY): Payer: Self-pay

## 2018-10-17 ENCOUNTER — Ambulatory Visit (HOSPITAL_COMMUNITY)
Admission: RE | Admit: 2018-10-17 | Discharge: 2018-10-17 | Disposition: A | Discharge status: Home / Self Care | Payer: BLUE CROSS/BLUE SHIELD | Source: Ambulatory Visit | Attending: Obstetrics and Gynecology | Admitting: Obstetrics and Gynecology

## 2018-10-17 DIAGNOSIS — Z3A12 12 weeks gestation of pregnancy: Secondary | ICD-10-CM | POA: Insufficient documentation

## 2018-10-17 DIAGNOSIS — Z3200 Encounter for pregnancy test, result unknown: Secondary | ICD-10-CM

## 2018-10-17 DIAGNOSIS — Z3687 Encounter for antenatal screening for uncertain dates: Secondary | ICD-10-CM | POA: Insufficient documentation

## 2018-10-17 DIAGNOSIS — O3680X0 Pregnancy with inconclusive fetal viability, not applicable or unspecified: Secondary | ICD-10-CM | POA: Diagnosis not present

## 2018-10-18 ENCOUNTER — Other Ambulatory Visit (HOSPITAL_COMMUNITY): Payer: Self-pay | Admitting: *Deleted

## 2018-10-18 DIAGNOSIS — O99212 Obesity complicating pregnancy, second trimester: Secondary | ICD-10-CM

## 2018-10-22 ENCOUNTER — Ambulatory Visit (INDEPENDENT_AMBULATORY_CARE_PROVIDER_SITE_OTHER): Payer: BLUE CROSS/BLUE SHIELD | Admitting: Obstetrics

## 2018-10-22 ENCOUNTER — Other Ambulatory Visit (HOSPITAL_COMMUNITY)
Admission: RE | Admit: 2018-10-22 | Discharge: 2018-10-22 | Disposition: A | Discharge status: Home / Self Care | Payer: BLUE CROSS/BLUE SHIELD | Source: Ambulatory Visit | Attending: Obstetrics | Admitting: Obstetrics

## 2018-10-22 ENCOUNTER — Encounter: Payer: Self-pay | Admitting: Obstetrics

## 2018-10-22 VITALS — BP 129/77 | HR 109 | Wt 363.1 lb

## 2018-10-22 DIAGNOSIS — O99210 Obesity complicating pregnancy, unspecified trimester: Secondary | ICD-10-CM

## 2018-10-22 DIAGNOSIS — Z3482 Encounter for supervision of other normal pregnancy, second trimester: Secondary | ICD-10-CM

## 2018-10-22 DIAGNOSIS — Z3480 Encounter for supervision of other normal pregnancy, unspecified trimester: Secondary | ICD-10-CM | POA: Insufficient documentation

## 2018-10-22 DIAGNOSIS — Z3490 Encounter for supervision of normal pregnancy, unspecified, unspecified trimester: Secondary | ICD-10-CM | POA: Insufficient documentation

## 2018-10-22 DIAGNOSIS — O34219 Maternal care for unspecified type scar from previous cesarean delivery: Secondary | ICD-10-CM

## 2018-10-22 DIAGNOSIS — O99212 Obesity complicating pregnancy, second trimester: Secondary | ICD-10-CM

## 2018-10-22 NOTE — Progress Notes (Signed)
Subjective:    Paula Sandoval is being seen today for her first obstetrical visit.  This is not a planned pregnancy. She is at [redacted]w[redacted]d gestation. Her obstetrical history is significant for obesity. Relationship with FOB: significant other, living together. Patient does intend to breast feed. Pregnancy history fully reviewed.  The information documented in the HPI was reviewed and verified.  Menstrual History: OB History    Gravida  3   Para  2   Term  2   Preterm      AB      Living  2     SAB      TAB      Ectopic      Multiple  0   Live Births  2            Patient's last menstrual period was 06/10/2018 (exact date).    Past Medical History:  Diagnosis Date  . Asthma   . 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 CESAREAN SECTION  02/22/2012   Procedure: CESAREAN SECTION;  Surgeon: Frederico Hamman, MD;  Location: [facility];  Service: Gynecology;  Laterality: N/A;  . excision of tumor right leg  2009    (Not in a hospital admission)  No Known Allergies  Social History   Tobacco Use  . Smoking status: Former Smoker    Types: Cigarettes    Last attempt to quit: 2008    Years since quitting: 11.9  . Smokeless tobacco: Never Used  Substance Use Topics  . Alcohol use: No    Family History  Problem Relation Age of Onset  . Cancer Maternal Grandmother   . Anesthesia problems Neg Hx   . Hypotension Neg Hx   . Malignant hyperthermia Neg Hx   . Pseudochol deficiency Neg Hx      Review of Systems Constitutional: negative for weight loss Gastrointestinal: negative for vomiting Genitourinary:negative for genital lesions and vaginal discharge and dysuria Musculoskeletal:negative for back pain Behavioral/Psych: negative for abusive relationship, depression, illegal drug usage and tobacco use    Objective:    BP 129/77   Pulse (!) 109   Wt (!) 363 lb 1.6 oz (164.7 kg)   LMP 06/10/2018 (Exact Date)   BMI  47.91 kg/m  General Appearance:    Alert, cooperative, no distress, appears stated age  Head:    Normocephalic, without obvious abnormality, atraumatic  Eyes:    PERRL, conjunctiva/corneas clear, EOM's intact, fundi    benign, both eyes  Ears:    Normal TM's and external ear canals, both ears  Nose:   Nares normal, septum midline, mucosa normal, no drainage    or sinus tenderness  Throat:   Lips, mucosa, and tongue normal; teeth and gums normal  Neck:   Supple, symmetrical, trachea midline, no adenopathy;    thyroid:  no enlargement/tenderness/nodules; no carotid   bruit or JVD  Back:     Symmetric, no curvature, ROM normal, no CVA tenderness  Lungs:     Clear to auscultation bilaterally, respirations unlabored  Chest Wall:    No tenderness or deformity   Heart:    Regular rate and rhythm, S1 and S2 normal, no murmur, rub   or gallop  Breast Exam:    No tenderness, masses, or nipple abnormality  Abdomen:     Soft, non-tender, bowel sounds active all four quadrants,    no masses, no organomegaly  Genitalia:  Normal female without lesion, discharge or tenderness  Extremities:   Extremities normal, atraumatic, no cyanosis or edema  Pulses:   2+ and symmetric all extremities  Skin:   Skin color, texture, turgor normal, no rashes or lesions  Lymph nodes:   Cervical, supraclavicular, and axillary nodes normal  Neurologic:   CNII-XII intact, normal strength, sensation and reflexes    throughout      Lab Review Urine pregnancy test Labs reviewed yes Radiologic studies reviewed yes  [place] MFM OB Comp Less 14 Wks (Accession [id number]) (Order [id number])  Imaging  Date: 10/17/2018 Department: [place] Released By: Novella Rob, RDMS Authorizing: Sloan Leiter, MD  Exam Information   Status Exam Begun  Exam Ended   Final [99] 10/17/2018 2:03 PM 10/17/2018 2:54 PM  PACS Images   Show images for [place] MFM OB Comp Less 14 Wks  Study Result    ----------------------------------------------------------------------  OBSTETRICS REPORT                       (Signed Final 10/17/2018 03:19 pm) ---------------------------------------------------------------------- Patient Info  ID #:       [id number]                          D.O.B.:  1991-08-11 (27 yrs)  Name:       Paula Sandoval                 Visit Date: 10/17/2018 02:16 pm ---------------------------------------------------------------------- Performed By  Performed By:     Novella Rob        Ref. Address:     [place]  Attending:        Lajoyce Lauber      Location:         [facility]                    MD  Referred By:      Sloan Leiter                    MD ---------------------------------------------------------------------- Orders   #  Description                          Code         Ordered By   1  [place] MFM OB COMP LESS THAN             76801.4      KELLY DAVIS      14 WEEKS  ----------------------------------------------------------------------   #  Order #                    Accession #                 Episode #   1  [id number]  [id number]                  [id number]  ---------------------------------------------------------------------- Indications   Pregnancy with inconclusive fetal viability    O36.80X0   Encounter for uncertain dates                  Z36.87   [redacted] weeks gestation of pregnancy                Z3A.12  ---------------------------------------------------------------------- Vital Signs  Weight (lb): 352                               Height:        6'1"  BMI:         46.44 ---------------------------------------------------------------------- Fetal Evaluation  Num Of Fetuses:         1  Fetal Heart Rate(bpm):  149  Cardiac Activity:       Observed  Amniotic Fluid  AFI FV:      Within normal  limits ---------------------------------------------------------------------- OB History  Gravidity:    3         Term:   2  Living:       2 ---------------------------------------------------------------------- Gestational Age  LMP:           18w 3d        Date:  06/10/18                 EDD:   03/17/19  Best:          12w 6d     Det. By:  Norva Pavlov 1st (10/17/18)     EDD:   04/25/19 ---------------------------------------------------------------------- 1st Trimester Genetic Sonogram Screening  CRL:            67.6  mm    G. Age:   12w 6d                 EDD:   04/25/19 ---------------------------------------------------------------------- Cervix Uterus Adnexa  Cervix  Normal appearance by transabdominal scan.  Uterus  No abnormality visualized.  Left Ovary  Within normal limits.  Right Ovary  Within normal limits.  Cul De Sac  No free fluid seen.  Adnexa  No abnormality visualized. ---------------------------------------------------------------------- Comments  U/S images reviewed. Findings reviewed with patient.  Dating is based on today's U/S.  CRL is c/w 12 weeks 6 days  yielding an EDC of 04/25/19.  No gross fetal anomalies are  identified.  Questions answered.  10 minutes spent face to face with patient.  Recommendations: 1) Detailed anatomic survey @ 18-20  weeks 2) Serial U/S every 4 weeks for fetal growth 3) Weekly  BPP beginning @ 36 weeks ---------------------------------------------------------------------- Recommendations  1) Detailed anatomic survey @ 18-20 weeks 2) Serial U/S  every 4 weeks for fetal growth 3) Weekly BPP beginning @  36 weeks ----------------------------------------------------------------------               Lajoyce Lauber, MD Electronically Signed Final Report   10/17/2018 03:19 pm ----------------------------------------------------------------------      Assessment:    Pregnancy at [redacted]w[redacted]d weeks    Plan:   1. Supervision of  other normal pregnancy, antepartum Rx: - Culture, OB Urine - Obstetric Panel, Including HIV - Cervicovaginal ancillary only( [place]) - Cytology - PAP( [place]) - Genetic Screening - Inheritest(R) CF/SMA Panel - Hemoglobinopathy evaluation  2. Obesity affecting pregnancy, antepartum  3. Previous cesarean delivery, antepartum condition  or complication - has had successful VBAC  - desires TOLAC with this pregnancy    Prenatal vitamins.  Counseling provided regarding continued use of seat belts, cessation of alcohol consumption, smoking or use of illicit drugs; infection precautions i.e., influenza/TDAP immunizations, toxoplasmosis,CMV, parvovirus, listeria and varicella; workplace safety, exercise during pregnancy; routine dental care, safe medications, sexual activity, hot tubs, saunas, pools, travel, caffeine use, fish and methlymercury, potential toxins, hair treatments, varicose veins Weight gain recommendations per IOM guidelines reviewed: underweight/BMI< 18.5--> gain 28 - 40 lbs; normal weight/BMI 18.5 - 24.9--> gain 25 - 35 lbs; overweight/BMI 25 - 29.9--> gain 15 - 25 lbs; obese/BMI >30->gain  11 - 20 lbs Problem list reviewed and updated. FIRST/CF mutation testing/NIPT/QUAD SCREEN/fragile X/Ashkenazi Jewish population testing/Spinal muscular atrophy discussed: requested. Role of ultrasound in pregnancy discussed; fetal survey: requested. Amniocentesis discussed: not indicated.  No orders of the defined types were placed in this encounter.  Orders Placed This Encounter  Procedures  . Culture, OB Urine  . Obstetric Panel, Including HIV  . Genetic Screening  . Inheritest(R) CF/SMA Panel  . Hemoglobinopathy evaluation    Follow up in 4 weeks. 50% of 20 min visit spent on counseling and coordination of care.     Shelly Bombard MD 10-22-2018

## 2018-10-23 LAB — CERVICOVAGINAL ANCILLARY ONLY
Bacterial vaginitis: POSITIVE — AB
CHLAMYDIA, DNA PROBE: NEGATIVE
Candida vaginitis: NEGATIVE
NEISSERIA GONORRHEA: NEGATIVE
Trichomonas: NEGATIVE

## 2018-10-24 ENCOUNTER — Other Ambulatory Visit: Payer: Self-pay | Admitting: Obstetrics

## 2018-10-24 DIAGNOSIS — B9689 Other specified bacterial agents as the cause of diseases classified elsewhere: Secondary | ICD-10-CM

## 2018-10-24 DIAGNOSIS — N760 Acute vaginitis: Principal | ICD-10-CM

## 2018-10-24 LAB — URINE CULTURE, OB REFLEX

## 2018-10-24 LAB — CULTURE, OB URINE

## 2018-10-24 MED ORDER — TINIDAZOLE 500 MG PO TABS: 1000.0000 mg | ORAL_TABLET | Freq: Every day | ORAL | 2 refills | Status: DC

## 2018-10-29 LAB — CYTOLOGY - PAP: DIAGNOSIS: NEGATIVE

## 2018-10-30 ENCOUNTER — Telehealth: Payer: Self-pay

## 2018-10-30 NOTE — Telephone Encounter (Signed)
Returned call, advised pt of results.

## 2018-11-05 ENCOUNTER — Telehealth: Payer: Self-pay | Admitting: *Deleted

## 2018-11-05 LAB — OBSTETRIC PANEL, INCLUDING HIV
Antibody Screen: NEGATIVE
BASOS ABS: 0 10*3/uL (ref 0.0–0.2)
Basos: 0 %
EOS (ABSOLUTE): 0.4 10*3/uL (ref 0.0–0.4)
Eos: 2 %
HIV SCREEN 4TH GENERATION: NONREACTIVE
Hematocrit: 37.2 % (ref 34.0–46.6)
Hemoglobin: 11.8 g/dL (ref 11.1–15.9)
Hepatitis B Surface Ag: NEGATIVE
IMMATURE GRANULOCYTES: 0 %
Immature Grans (Abs): 0 10*3/uL (ref 0.0–0.1)
LYMPHS: 19 %
Lymphocytes Absolute: 2.8 10*3/uL (ref 0.7–3.1)
MCH: 25.5 pg — ABNORMAL LOW (ref 26.6–33.0)
MCHC: 31.7 g/dL (ref 31.5–35.7)
MCV: 80 fL (ref 79–97)
MONOCYTES: 6 %
Monocytes Absolute: 1 10*3/uL — ABNORMAL HIGH (ref 0.1–0.9)
NEUTROS PCT: 73 %
Neutrophils Absolute: 10.9 10*3/uL — ABNORMAL HIGH (ref 1.4–7.0)
PLATELETS: 425 10*3/uL (ref 150–450)
RBC: 4.63 x10E6/uL (ref 3.77–5.28)
RDW: 14.6 % (ref 12.3–15.4)
RPR: NONREACTIVE
Rh Factor: POSITIVE
Rubella Antibodies, IGG: 11.6 index (ref >0.99)
WBC: 15.2 10*3/uL — AB (ref 3.4–10.8)

## 2018-11-05 LAB — HEMOGLOBINOPATHY EVALUATION
HEMOGLOBIN A2 QUANTITATION: 1.8 % (ref 1.8–3.2)
HEMOGLOBIN F QUANTITATION: 0 % (ref 0.0–2.0)
HGB C: 0 %
HGB S: 0 %
HGB VARIANT: 0 %
Hgb A: 98.2 % (ref 96.4–98.8)

## 2018-11-05 LAB — INHERITEST(R) CF/SMA PANEL

## 2018-11-05 NOTE — Telephone Encounter (Signed)
Pt called to office for Genetic Screening results. Pt made aware results are not yet back, may be delay due to 2 holidays since drawn. Pt advised to call back later this week to check status.

## 2018-11-07 ENCOUNTER — Telehealth: Payer: Self-pay | Admitting: *Deleted

## 2018-11-07 NOTE — Telephone Encounter (Signed)
Pt called to office for lab results. Return call to pt. Pt states she has reviewed labs other than genetic screen. Pulled pt record from [facility], Insufficient DNA reported. Explained this to pt and offered to have test redrawn.  Pt states she will wait at this time since u/s in appox 1-2 weeks.

## 2018-11-15 ENCOUNTER — Other Ambulatory Visit: Payer: BLUE CROSS/BLUE SHIELD

## 2018-11-15 ENCOUNTER — Encounter: Payer: BLUE CROSS/BLUE SHIELD | Admitting: Obstetrics

## 2018-11-22 ENCOUNTER — Telehealth: Payer: Self-pay

## 2018-11-22 NOTE — Telephone Encounter (Signed)
Returned call and pt stated that she may have been bit by a bug on her arm. Pt sates that she has red, itchy bump. Advised of otc cream, and to go to urgent care if symptoms get worse, pt agreed.

## 2018-11-28 ENCOUNTER — Ambulatory Visit (HOSPITAL_COMMUNITY)
Admission: RE | Admit: 2018-11-28 | Discharge: 2018-11-28 | Disposition: A | Discharge status: Home / Self Care | Payer: BLUE CROSS/BLUE SHIELD | Source: Ambulatory Visit | Attending: Obstetrics | Admitting: Obstetrics

## 2018-11-28 ENCOUNTER — Other Ambulatory Visit (HOSPITAL_COMMUNITY): Payer: Self-pay | Admitting: Obstetrics

## 2018-11-28 ENCOUNTER — Encounter (HOSPITAL_COMMUNITY): Payer: Self-pay

## 2018-11-28 ENCOUNTER — Other Ambulatory Visit (HOSPITAL_COMMUNITY): Payer: Self-pay | Admitting: *Deleted

## 2018-11-28 DIAGNOSIS — O99212 Obesity complicating pregnancy, second trimester: Secondary | ICD-10-CM | POA: Diagnosis not present

## 2018-11-28 DIAGNOSIS — Z363 Encounter for antenatal screening for malformations: Secondary | ICD-10-CM | POA: Diagnosis not present

## 2018-11-28 DIAGNOSIS — O34219 Maternal care for unspecified type scar from previous cesarean delivery: Secondary | ICD-10-CM

## 2018-11-28 DIAGNOSIS — Z3A18 18 weeks gestation of pregnancy: Secondary | ICD-10-CM

## 2018-11-28 DIAGNOSIS — Z362 Encounter for other antenatal screening follow-up: Secondary | ICD-10-CM

## 2018-12-04 ENCOUNTER — Telehealth (HOSPITAL_COMMUNITY): Payer: Self-pay | Admitting: *Deleted

## 2018-12-04 NOTE — Telephone Encounter (Signed)
Name and DOB verified.  Negative materniT21 result given.  Pt voiced understanding.

## 2018-12-05 ENCOUNTER — Other Ambulatory Visit: Payer: Self-pay

## 2018-12-05 ENCOUNTER — Other Ambulatory Visit: Payer: BLUE CROSS/BLUE SHIELD

## 2018-12-05 ENCOUNTER — Encounter: Payer: Self-pay | Admitting: Obstetrics

## 2018-12-05 ENCOUNTER — Ambulatory Visit (INDEPENDENT_AMBULATORY_CARE_PROVIDER_SITE_OTHER): Payer: BLUE CROSS/BLUE SHIELD | Admitting: Obstetrics

## 2018-12-05 VITALS — BP 140/85 | HR 101 | Wt 360.4 lb

## 2018-12-05 DIAGNOSIS — Z3482 Encounter for supervision of other normal pregnancy, second trimester: Secondary | ICD-10-CM

## 2018-12-05 DIAGNOSIS — Z3480 Encounter for supervision of other normal pregnancy, unspecified trimester: Secondary | ICD-10-CM

## 2018-12-05 DIAGNOSIS — O34219 Maternal care for unspecified type scar from previous cesarean delivery: Secondary | ICD-10-CM

## 2018-12-05 DIAGNOSIS — O99212 Obesity complicating pregnancy, second trimester: Secondary | ICD-10-CM

## 2018-12-05 DIAGNOSIS — O99210 Obesity complicating pregnancy, unspecified trimester: Secondary | ICD-10-CM

## 2018-12-05 NOTE — Progress Notes (Signed)
Subjective:  Paula Sandoval is a 28 y.o. G3P2002 at [redacted]w[redacted]d being seen today for ongoing prenatal care.  She is currently monitored for the following issues for this high-risk pregnancy and has Morbid obesity with BMI of 45.0-49.9, adult ([place]); Previous cesarean delivery, antepartum condition or complication; and Encounter for supervision of normal pregnancy, antepartum on their problem list.  Patient reports no complaints.  Contractions: Not present. Vag. Bleeding: None.  Movement: Present. Denies leaking of fluid.   The following portions of the patient's history were reviewed and updated as appropriate: allergies, current medications, past family history, past medical history, past social history, past surgical history and problem list. Problem list updated.  Objective:   Vitals:   12/05/18 0817  BP: 140/85  Pulse: (!) 101  Weight: (!) 360 lb 6.4 oz (163.5 kg)    Fetal Status:     Movement: Present     General:  Alert, oriented and cooperative. Patient is in no acute distress.  Skin: Skin is warm and dry. No rash noted.   Cardiovascular: Normal heart rate noted  Respiratory: Normal respiratory effort, no problems with respiration noted  Abdomen: Soft, gravid, appropriate for gestational age. Pain/Pressure: Absent     Pelvic:  Cervical exam deferred        Extremities: Normal range of motion.  Edema: Trace  Mental Status: Normal mood and affect. Normal behavior. Normal judgment and thought content.   Urinalysis:      Assessment and Plan:  Pregnancy: G3P2002 at [redacted]w[redacted]d  1. Supervision of other normal pregnancy, antepartum Rx: - AFP, Serum, Open Spina Bifida - Glucose Tolerance, 2 Hours w/1 Hour - CBC - HIV antibody (with reflex) - RPR  2. Obesity affecting pregnancy, antepartum  3. Previous cesarean delivery, antepartum condition or complication   Preterm labor symptoms and general obstetric precautions including but not limited to vaginal bleeding, contractions, leaking of  fluid and fetal movement were reviewed in detail with the patient. Please refer to After Visit Summary for other counseling recommendations.  Return in about 4 weeks (around 01/02/2019).   Shelly Bombard, MD

## 2018-12-08 LAB — AFP, SERUM, OPEN SPINA BIFIDA
AFP MOM: 1.28
AFP VALUE AFPOSL: 47.4 ng/mL
Gest. Age on Collection Date: 19.6 weeks
Maternal Age At EDD: 28.9 yr
OSBR RISK 1 IN: 10000
TEST RESULTS AFP: NEGATIVE
Weight: 360 [lb_av]

## 2018-12-08 LAB — GLUCOSE TOLERANCE, 2 HOURS W/ 1HR
GLUCOSE, FASTING: 86 mg/dL (ref 65–91)
Glucose, 1 hour: 161 mg/dL (ref 65–179)
Glucose, 2 hour: 96 mg/dL (ref 65–152)

## 2018-12-08 LAB — CBC
HEMATOCRIT: 37.8 % (ref 34.0–46.6)
Hemoglobin: 11.8 g/dL (ref 11.1–15.9)
MCH: 25.5 pg — AB (ref 26.6–33.0)
MCHC: 31.2 g/dL — AB (ref 31.5–35.7)
MCV: 82 fL (ref 79–97)
Platelets: 366 10*3/uL (ref 150–450)
RBC: 4.63 x10E6/uL (ref 3.77–5.28)
RDW: 14.2 % (ref 11.7–15.4)
WBC: 12.3 10*3/uL — ABNORMAL HIGH (ref 3.4–10.8)

## 2018-12-08 LAB — HIV ANTIBODY (ROUTINE TESTING W REFLEX): HIV Screen 4th Generation wRfx: NONREACTIVE

## 2018-12-08 LAB — RPR: RPR Ser Ql: NONREACTIVE

## 2018-12-28 ENCOUNTER — Other Ambulatory Visit (HOSPITAL_COMMUNITY): Payer: Self-pay | Admitting: *Deleted

## 2018-12-28 ENCOUNTER — Encounter (HOSPITAL_COMMUNITY): Payer: Self-pay

## 2018-12-28 ENCOUNTER — Ambulatory Visit (HOSPITAL_COMMUNITY)
Admission: RE | Admit: 2018-12-28 | Discharge: 2018-12-28 | Disposition: A | Discharge status: Home / Self Care | Payer: BLUE CROSS/BLUE SHIELD | Source: Ambulatory Visit | Attending: Obstetrics | Admitting: Obstetrics

## 2018-12-28 ENCOUNTER — Ambulatory Visit (HOSPITAL_COMMUNITY): Payer: BLUE CROSS/BLUE SHIELD | Admitting: *Deleted

## 2018-12-28 VITALS — BP 111/73 | HR 110 | Wt 359.4 lb

## 2018-12-28 DIAGNOSIS — O99212 Obesity complicating pregnancy, second trimester: Secondary | ICD-10-CM

## 2018-12-28 DIAGNOSIS — O9989 Other specified diseases and conditions complicating pregnancy, childbirth and the puerperium: Secondary | ICD-10-CM

## 2018-12-28 DIAGNOSIS — Z3A23 23 weeks gestation of pregnancy: Secondary | ICD-10-CM

## 2018-12-28 DIAGNOSIS — Z0489 Encounter for examination and observation for other specified reasons: Secondary | ICD-10-CM | POA: Diagnosis present

## 2018-12-28 DIAGNOSIS — J45909 Unspecified asthma, uncomplicated: Secondary | ICD-10-CM

## 2018-12-28 DIAGNOSIS — IMO0002 Reserved for concepts with insufficient information to code with codable children: Secondary | ICD-10-CM

## 2018-12-28 DIAGNOSIS — O34219 Maternal care for unspecified type scar from previous cesarean delivery: Secondary | ICD-10-CM

## 2018-12-28 DIAGNOSIS — Z362 Encounter for other antenatal screening follow-up: Secondary | ICD-10-CM | POA: Diagnosis present

## 2019-01-02 ENCOUNTER — Encounter: Payer: BLUE CROSS/BLUE SHIELD | Admitting: Obstetrics

## 2019-01-23 ENCOUNTER — Ambulatory Visit (HOSPITAL_COMMUNITY): Admission: RE | Admit: 2019-01-23 | Payer: BLUE CROSS/BLUE SHIELD | Source: Ambulatory Visit

## 2019-01-23 ENCOUNTER — Ambulatory Visit (HOSPITAL_COMMUNITY): Payer: BLUE CROSS/BLUE SHIELD

## 2019-02-20 ENCOUNTER — Ambulatory Visit (HOSPITAL_COMMUNITY): Payer: BLUE CROSS/BLUE SHIELD | Admitting: *Deleted

## 2019-02-20 ENCOUNTER — Other Ambulatory Visit: Payer: Self-pay

## 2019-02-20 ENCOUNTER — Ambulatory Visit (HOSPITAL_COMMUNITY)
Admission: RE | Admit: 2019-02-20 | Discharge: 2019-02-20 | Disposition: A | Discharge status: Home / Self Care | Payer: BLUE CROSS/BLUE SHIELD | Source: Ambulatory Visit | Attending: Obstetrics and Gynecology | Admitting: Obstetrics and Gynecology

## 2019-02-20 ENCOUNTER — Other Ambulatory Visit (HOSPITAL_COMMUNITY): Payer: Self-pay | Admitting: *Deleted

## 2019-02-20 ENCOUNTER — Encounter (HOSPITAL_COMMUNITY): Payer: Self-pay

## 2019-02-20 VITALS — Temp 98.5°F

## 2019-02-20 DIAGNOSIS — Z362 Encounter for other antenatal screening follow-up: Secondary | ICD-10-CM

## 2019-02-20 DIAGNOSIS — O0990 Supervision of high risk pregnancy, unspecified, unspecified trimester: Secondary | ICD-10-CM | POA: Diagnosis present

## 2019-02-20 DIAGNOSIS — O99213 Obesity complicating pregnancy, third trimester: Secondary | ICD-10-CM

## 2019-02-20 DIAGNOSIS — O34219 Maternal care for unspecified type scar from previous cesarean delivery: Secondary | ICD-10-CM | POA: Diagnosis not present

## 2019-02-20 DIAGNOSIS — O9989 Other specified diseases and conditions complicating pregnancy, childbirth and the puerperium: Secondary | ICD-10-CM

## 2019-02-20 DIAGNOSIS — J45909 Unspecified asthma, uncomplicated: Secondary | ICD-10-CM

## 2019-02-20 DIAGNOSIS — O99210 Obesity complicating pregnancy, unspecified trimester: Secondary | ICD-10-CM

## 2019-02-20 DIAGNOSIS — Z3A30 30 weeks gestation of pregnancy: Secondary | ICD-10-CM

## 2019-02-25 ENCOUNTER — Ambulatory Visit (INDEPENDENT_AMBULATORY_CARE_PROVIDER_SITE_OTHER): Payer: BLUE CROSS/BLUE SHIELD | Admitting: Obstetrics & Gynecology

## 2019-02-25 ENCOUNTER — Other Ambulatory Visit: Payer: Self-pay

## 2019-02-25 DIAGNOSIS — Z3483 Encounter for supervision of other normal pregnancy, third trimester: Secondary | ICD-10-CM

## 2019-02-25 DIAGNOSIS — O34219 Maternal care for unspecified type scar from previous cesarean delivery: Secondary | ICD-10-CM

## 2019-02-25 DIAGNOSIS — Z3480 Encounter for supervision of other normal pregnancy, unspecified trimester: Secondary | ICD-10-CM

## 2019-02-25 DIAGNOSIS — Z3A31 31 weeks gestation of pregnancy: Secondary | ICD-10-CM

## 2019-02-25 NOTE — Progress Notes (Signed)
Pt is on the phone preparing for Webex visit with provider. [redacted]w[redacted]d

## 2019-02-25 NOTE — Progress Notes (Signed)
   TELEHEALTH [place] VISIT ENCOUNTER NOTE  I connected with@ on 02/25/19 at  8:15 AM EDT via WebEx at home and verified that I am speaking with the correct person using two identifiers.   I discussed the limitations, risks, security and privacy concerns of performing an evaluation and management service by telephone and the availability of in person appointments. I also discussed with the patient that there may be a patient responsible charge related to this service. The patient expressed understanding and agreed to proceed.  Subjective:  Paula Sandoval is a 28 y.o. G3P2002 at [redacted]w[redacted]d being followed for ongoing prenatal care.  She is currently monitored for the following issues for this low-risk pregnancy and has Morbid obesity with BMI of 45.0-49.9, adult ([place]); Previous cesarean delivery, antepartum condition or complication; and Encounter for supervision of normal pregnancy, antepartum on their problem list.  Patient reports backache. Reports fetal movement. Denies any regular contractions, bleeding or leaking of fluid.   The following portions of the patient's history were reviewed and updated as appropriate: allergies, current medications, past family history, past medical history, past social history, past surgical history and problem list.   Objective:  LMP 06/10/2018 (Exact Date)   General:  Alert, oriented and cooperative. Patient is in no acute distress.  Mental Status: Normal mood and affect. Normal behavior. Normal judgment and thought content.   Respiratory: Normal respiratory effort noted, no problems with respiration noted  Rest of physical exam deferred due to type of encounter  Assessment and Plan:  Pregnancy: G3P2002 at [redacted]w[redacted]d 1. Previous cesarean delivery, antepartum condition or complication Counseled regarding TOLAC vs RCS; risks/benefits discussed in detail. All questions answered.  Patient elects for TOLAC, consent signed to be signed soon (will come in for RN  visit on 02/27/19)  2. Supervision of other normal pregnancy, antepartum - Babyscripts Schedule Optimization, will get BP cuff mailed to her. Counseled about BTL, consent will be signed on 02/27/19.  Preterm labor symptoms and general obstetric precautions including but not limited to vaginal bleeding, contractions, leaking of fluid and fetal movement were reviewed in detail with the patient.  I discussed the assessment and treatment plan with the patient. The patient was provided an opportunity to ask questions and all were answered. The patient agreed with the plan and demonstrated an understanding of the instructions. The patient was advised to call back or seek an in-person office evaluation/go to [facility] at [facility] for any urgent or concerning symptoms. Please refer to After Visit Summary for other counseling recommendations.   Return in about 2 weeks (around 03/11/2019) for Virtual OB Visit  (will be coming 02/27/19 to sign VBAC and BTL papers).  Future Appointments  Date Time Provider [place]  04/03/2019  7:40 AM [place] [facility]  04/03/2019  7:45 AM [place] 2 [facility]    Verita Schneiders, MD [facility] [place], [place]

## 2019-03-13 ENCOUNTER — Encounter: Payer: Self-pay | Admitting: Obstetrics & Gynecology

## 2019-03-13 ENCOUNTER — Other Ambulatory Visit: Payer: Self-pay

## 2019-03-13 ENCOUNTER — Ambulatory Visit: Payer: BLUE CROSS/BLUE SHIELD | Admitting: Obstetrics & Gynecology

## 2019-03-13 DIAGNOSIS — Z3480 Encounter for supervision of other normal pregnancy, unspecified trimester: Secondary | ICD-10-CM

## 2019-03-13 NOTE — Patient Instructions (Signed)

## 2019-03-13 NOTE — Progress Notes (Signed)
ROB w/ no complaints.  

## 2019-04-01 ENCOUNTER — Telehealth: Payer: Self-pay

## 2019-04-01 NOTE — Telephone Encounter (Signed)
Patient sent messgae to MyChart requesting in office appt.  Patient is 36 wks needs 36 week test done in office  Message sent to scheduling to make appt  Message also sent to patient that she would be receiving a call for that appt.

## 2019-04-03 ENCOUNTER — Encounter (HOSPITAL_COMMUNITY): Payer: Self-pay

## 2019-04-03 ENCOUNTER — Ambulatory Visit (HOSPITAL_COMMUNITY): Payer: BC Managed Care – PPO | Admitting: *Deleted

## 2019-04-03 ENCOUNTER — Other Ambulatory Visit (HOSPITAL_COMMUNITY)
Admission: RE | Admit: 2019-04-03 | Discharge: 2019-04-03 | Disposition: A | Discharge status: Home / Self Care | Payer: BC Managed Care – PPO | Source: Ambulatory Visit | Attending: Obstetrics & Gynecology | Admitting: Obstetrics & Gynecology

## 2019-04-03 ENCOUNTER — Other Ambulatory Visit: Payer: Self-pay

## 2019-04-03 ENCOUNTER — Ambulatory Visit (HOSPITAL_COMMUNITY)
Admission: RE | Admit: 2019-04-03 | Discharge: 2019-04-03 | Disposition: A | Discharge status: Home / Self Care | Payer: BC Managed Care – PPO | Source: Ambulatory Visit | Attending: Obstetrics and Gynecology | Admitting: Obstetrics and Gynecology

## 2019-04-03 ENCOUNTER — Ambulatory Visit (INDEPENDENT_AMBULATORY_CARE_PROVIDER_SITE_OTHER): Payer: BC Managed Care – PPO | Admitting: Obstetrics & Gynecology

## 2019-04-03 VITALS — BP 114/71 | HR 107 | Temp 98.5°F

## 2019-04-03 VITALS — BP 121/82 | HR 108 | Wt 359.0 lb

## 2019-04-03 DIAGNOSIS — O9989 Other specified diseases and conditions complicating pregnancy, childbirth and the puerperium: Secondary | ICD-10-CM

## 2019-04-03 DIAGNOSIS — J45909 Unspecified asthma, uncomplicated: Secondary | ICD-10-CM

## 2019-04-03 DIAGNOSIS — O0990 Supervision of high risk pregnancy, unspecified, unspecified trimester: Secondary | ICD-10-CM | POA: Insufficient documentation

## 2019-04-03 DIAGNOSIS — O99213 Obesity complicating pregnancy, third trimester: Secondary | ICD-10-CM

## 2019-04-03 DIAGNOSIS — O99820 Streptococcus B carrier state complicating pregnancy: Secondary | ICD-10-CM

## 2019-04-03 DIAGNOSIS — O99210 Obesity complicating pregnancy, unspecified trimester: Secondary | ICD-10-CM | POA: Diagnosis not present

## 2019-04-03 DIAGNOSIS — O34219 Maternal care for unspecified type scar from previous cesarean delivery: Secondary | ICD-10-CM

## 2019-04-03 DIAGNOSIS — Z362 Encounter for other antenatal screening follow-up: Secondary | ICD-10-CM

## 2019-04-03 DIAGNOSIS — Z3A36 36 weeks gestation of pregnancy: Secondary | ICD-10-CM

## 2019-04-03 DIAGNOSIS — Z3480 Encounter for supervision of other normal pregnancy, unspecified trimester: Secondary | ICD-10-CM

## 2019-04-03 NOTE — Patient Instructions (Signed)
Vaginal Birth After Cesarean Delivery  Vaginal birth after cesarean delivery (VBAC) is giving birth vaginally after previously delivering a baby through a cesarean section (C-section). A VBAC may be a safe option for you, depending on your health and other factors. It is important to discuss VBAC with your health care provider early in your pregnancy so you can understand the risks, benefits, and options. Having these discussions early will give you time to make your birth plan. Who are the best candidates for VBAC? The best candidates for VBAC are women who:  Have had one or two prior cesarean deliveries, and the incision made during the delivery was horizontal (low transverse).  Do not have a vertical (classical) scar on their uterus.  Have not had a tear in the wall of their uterus (uterine rupture).  Plan to have more pregnancies. A VBAC is also more likely to be successful:  In women who have previously given birth vaginally.  When labor starts by itself (spontaneously) before the due date. What are the benefits of VBAC? The benefits of delivering your baby vaginally instead of by a cesarean delivery include:  A shorter hospital stay.  A faster recovery time.  Less pain.  Avoiding risks associated with major surgery, such as infection and blood clots.  Less blood loss and less need for donated blood (transfusions). What are the risks of VBAC? The main risk of attempting a VBAC is that it may fail, forcing your health care provider to deliver your baby by a C-section. Other risks are rare and include:  Tearing (rupture) of the scar from a past cesarean delivery.  Other risks associated with vaginal deliveries. If a repeat cesarean delivery is needed, the risks include:  Blood loss.  Infection.  Blood clot.  Damage to surrounding organs.  Removal of the uterus (hysterectomy), if it is damaged.  Placenta problems in future pregnancies. What else should I know  about my options? Delivering a baby through a VBAC is similar to having a normal spontaneous vaginal delivery. Therefore, it is safe:  To try with twins.  For your health care provider to try to turn the baby from a breech position (external cephalic version) during labor.  With epidural analgesia for pain relief. Consider where you would like to deliver your baby. VBAC should be attempted in facilities where an emergency cesarean delivery can be performed. VBAC is not recommended for home births. Any changes in your health or your baby's health during your pregnancy may make it necessary to change your initial decision about VBAC. Your health care provider may recommend that you do not attempt a VBAC if:  Your baby's suspected weight is 8.8 lb (4 kg) or more.  You have preeclampsia. This is a condition that causes high blood pressure along with other symptoms, such as swelling and headaches.  You will have VBAC less than 19 months after your cesarean delivery.  You are past your due date.  You need to have labor started (induced) because your cervix is not ready for labor (unfavorable). Where to find more information  American Pregnancy Association: americanpregnancy.org  [employer] of Obstetricians and Gynecologists: acog.org Summary  Vaginal birth after cesarean delivery (VBAC) is giving birth vaginally after previously delivering a baby through a cesarean section (C-section). A VBAC may be a safe option for you, depending on your health and other factors.  Discuss VBAC with your health care provider early in your pregnancy so you can understand the risks, benefits, options, and  have plenty of time to make your birth plan.  The main risk of attempting a VBAC is that it may fail, forcing your health care provider to deliver your baby by a C-section. Other risks are rare. This information is not intended to replace advice given to you by your health care provider. Make sure  you discuss any questions you have with your health care provider. Document Released: 04/09/2007 Document Revised: 01/24/2017 Document Reviewed: 01/24/2017 Elsevier Interactive Patient Education  2019 [employer].

## 2019-04-03 NOTE — Progress Notes (Signed)
BTL papers signed today.

## 2019-04-03 NOTE — Progress Notes (Signed)
   PRENATAL VISIT NOTE  Subjective:  Paula Sandoval is a 28 y.o. G3P2002 at [redacted]w[redacted]d being seen today for ongoing prenatal care.  She is currently monitored for the following issues for this high-risk pregnancy and has Morbid obesity with BMI of 45.0-49.9, adult ([place]); Previous cesarean delivery, antepartum condition or complication; and Encounter for supervision of normal pregnancy, antepartum on their problem list.  Patient reports occasional contractions.  Contractions: Irregular. Vag. Bleeding: None.  Movement: Present. Denies leaking of fluid.   The following portions of the patient's history were reviewed and updated as appropriate: allergies, current medications, past family history, past medical history, past social history, past surgical history and problem list.   Objective:   Vitals:   04/03/19 1013  BP: 121/82  Pulse: (!) 108  Weight: (!) 359 lb (162.8 kg)    Fetal Status: Fetal Heart Rate (bpm): 140   Movement: Present  Presentation: Vertex  General:  Alert, oriented and cooperative. Patient is in no acute distress.  Skin: Skin is warm and dry. No rash noted.   Cardiovascular: Normal heart rate noted  Respiratory: Normal respiratory effort, no problems with respiration noted  Abdomen: Soft, gravid, appropriate for gestational age.  Pain/Pressure: Present     Pelvic: Cervical exam performed Dilation: 1.5 Effacement (%): 20 Station: Ballotable  Extremities: Normal range of motion.     Mental Status: Normal mood and affect. Normal behavior. Normal judgment and thought content.   Assessment and Plan:  Pregnancy: G3P2002 at [redacted]w[redacted]d 1. Supervision of other normal pregnancy, antepartum Routine test - Strep Gp B NAA - Cervicovaginal ancillary only( [place])  2. Previous cesarean delivery, antepartum condition or complication Possible LGA  Preterm labor symptoms and general obstetric precautions including but not limited to vaginal bleeding, contractions, leaking of fluid  and fetal movement were reviewed in detail with the patient. Please refer to After Visit Summary for other counseling recommendations.   Return in about 1 week (around 04/10/2019) for virtual if she is doing BP.  No future appointments.  Emeterio Reeve, MD

## 2019-04-05 LAB — CERVICOVAGINAL ANCILLARY ONLY
Chlamydia: NEGATIVE
Neisseria Gonorrhea: NEGATIVE

## 2019-04-05 LAB — STREP GP B NAA: Strep Gp B NAA: POSITIVE — AB

## 2019-04-06 ENCOUNTER — Other Ambulatory Visit: Payer: Self-pay

## 2019-04-06 ENCOUNTER — Encounter (HOSPITAL_COMMUNITY): Payer: Self-pay

## 2019-04-06 ENCOUNTER — Inpatient Hospital Stay (HOSPITAL_COMMUNITY)
Admission: AD | Admit: 2019-04-06 | Discharge: 2019-04-07 | Disposition: A | Discharge status: Home / Self Care | Payer: BC Managed Care – PPO | Attending: Obstetrics and Gynecology | Admitting: Obstetrics and Gynecology

## 2019-04-06 DIAGNOSIS — R030 Elevated blood-pressure reading, without diagnosis of hypertension: Secondary | ICD-10-CM | POA: Diagnosis not present

## 2019-04-06 DIAGNOSIS — O26893 Other specified pregnancy related conditions, third trimester: Secondary | ICD-10-CM | POA: Diagnosis present

## 2019-04-06 DIAGNOSIS — Z3A37 37 weeks gestation of pregnancy: Secondary | ICD-10-CM | POA: Diagnosis not present

## 2019-04-06 DIAGNOSIS — O139 Gestational [pregnancy-induced] hypertension without significant proteinuria, unspecified trimester: Secondary | ICD-10-CM | POA: Diagnosis not present

## 2019-04-06 DIAGNOSIS — Z87891 Personal history of nicotine dependence: Secondary | ICD-10-CM | POA: Diagnosis not present

## 2019-04-06 LAB — PROTEIN / CREATININE RATIO, URINE
Creatinine, Urine: 240.28 mg/dL
Protein Creatinine Ratio: 0.1 mg/mg{Cre} (ref 0.00–0.15)
Total Protein, Urine: 23 mg/dL

## 2019-04-06 LAB — COMPREHENSIVE METABOLIC PANEL
ALT: 16 U/L (ref 0–44)
AST: 15 U/L (ref 15–41)
Albumin: 2.8 g/dL — ABNORMAL LOW (ref 3.5–5.0)
Alkaline Phosphatase: 140 U/L — ABNORMAL HIGH (ref 38–126)
Anion gap: 4 — ABNORMAL LOW (ref 5–15)
BUN: 9 mg/dL (ref 6–20)
CO2: 26 mmol/L (ref 22–32)
Calcium: 8.9 mg/dL (ref 8.9–10.3)
Chloride: 104 mmol/L (ref 98–111)
Creatinine, Ser: 0.93 mg/dL (ref 0.44–1.00)
GFR calc Af Amer: >60 mL/min (ref >60)
GFR calc non Af Amer: >60 mL/min (ref >60)
Glucose, Bld: 129 mg/dL — ABNORMAL HIGH (ref 70–99)
Potassium: 3.8 mmol/L (ref 3.5–5.1)
Sodium: 134 mmol/L — ABNORMAL LOW (ref 135–145)
Total Bilirubin: 0.3 mg/dL (ref 0.3–1.2)
Total Protein: 6.8 g/dL (ref 6.5–8.1)

## 2019-04-06 LAB — CBC
HCT: 36.6 % (ref 36.0–46.0)
Hemoglobin: 10.9 g/dL — ABNORMAL LOW (ref 12.0–15.0)
MCH: 23.4 pg — ABNORMAL LOW (ref 26.0–34.0)
MCHC: 29.8 g/dL — ABNORMAL LOW (ref 30.0–36.0)
MCV: 78.7 fL — ABNORMAL LOW (ref 80.0–100.0)
Platelets: 373 10*3/uL (ref 150–400)
RBC: 4.65 MIL/uL (ref 3.87–5.11)
RDW: 16.2 % — ABNORMAL HIGH (ref 11.5–15.5)
WBC: 13.9 10*3/uL — ABNORMAL HIGH (ref 4.0–10.5)
nRBC: 0 % (ref 0.0–0.2)

## 2019-04-06 NOTE — MAU Provider Note (Signed)
History     CSN: [id number]  Arrival date and time: 04/06/19 2025   First Provider Initiated Contact with Patient 04/06/19 2128      Chief Complaint  Patient presents with  . Labor Eval   Paula Sandoval is a 28 y.o. G3P2002 at [redacted]w[redacted]d who receives care at [facility].  She presents today for Labor Eval, but found to have elevated bp.  She denies HA, visual disturbances, RUQ pain, or SOB.  She does endorse some swelling, but no current numbness or tingling.  Patient denies a history of HTN or GHTN or PreEclampsia in previus pregnancies.  Patient endorses discharge stating it is clear, but no bleeding or leaking of fluid.  Patient reports her contractions started about 1pm and have been ongoing.  She endorses fetal movement.       OB History    Gravida  3   Para  2   Term  2   Preterm      AB      Living  2     SAB      TAB      Ectopic      Multiple  0   Live Births  2           Past Medical History:  Diagnosis Date  . Asthma   . 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 CESAREAN SECTION  02/22/2012   Procedure: CESAREAN SECTION;  Surgeon: Frederico Hamman, MD;  Location: [facility];  Service: Gynecology;  Laterality: N/A;  . excision of tumor right leg  2009    Family History  Problem Relation Age of Onset  . Cancer Maternal Grandmother   . Anesthesia problems Neg Hx   . Hypotension Neg Hx   . Malignant hyperthermia Neg Hx   . Pseudochol deficiency Neg Hx     Social History   Tobacco Use  . Smoking status: Former Smoker    Types: Cigarettes    Last attempt to quit: 2008    Years since quitting: 12.4  . Smokeless tobacco: Never Used  Substance Use Topics  . Alcohol use: No  . Drug use: No    Allergies: No Known Allergies  Medications Prior to Admission  Medication Sig Dispense Refill Last Dose  . Prenatal Vit-Fe Fumarate-FA (PRENATAL MULTIVITAMIN) TABS tablet Take 1 tablet by mouth daily at 12  noon. 60 tablet 1 Taking  . tinidazole (TINDAMAX) 500 MG tablet Take 2 tablets (1,000 mg total) by mouth daily with breakfast. (Patient not taking: Reported on 11/28/2018) 10 tablet 2 Not Taking    Review of Systems  Eyes: Negative for visual disturbance.  Respiratory: Negative for cough and shortness of breath.   Gastrointestinal: Positive for abdominal pain (With contractions).  Genitourinary: Positive for vaginal discharge. Negative for vaginal bleeding.  Neurological: Negative for dizziness, light-headedness and headaches.   Physical Exam   Blood pressure (!) 160/80, pulse (!) 110, temperature 98.5 F (36.9 C), temperature source Oral, resp. rate 20, height 5\' 9"  (1.753 m), weight (!) 161.7 kg, last menstrual period 06/10/2018, SpO2 97 %.   Vitals:   04/06/19 2055 04/06/19 2100 04/06/19 2115 04/06/19 2130  BP: (!) 146/77 (!) 156/85 (!) 160/80 (!) 150/71  Pulse: (!) 109 (!) 117 (!) 110 (!) 117  Resp: 20  20 20   Temp:      TempSrc:      SpO2:    96%  Weight:      Height:        Vitals:   04/06/19 2330 04/06/19 2345 04/06/19 2354 04/07/19 0020  BP: 135/87 (!) 133/91 (!) 132/96 137/85  Pulse: 99 98 95 (!) 104  Resp: 18 18 18 17   Temp:    97.9 F (36.6 C)  TempSrc:    Oral  SpO2: 96% 96%  98%  Weight:      Height:         Physical Exam  Constitutional: She is oriented to person, place, and time.  Obese  HENT:  Head: Normocephalic and atraumatic.  Eyes: Conjunctivae are normal.  Neck: Normal range of motion.  Cardiovascular: Normal rate, regular rhythm and normal heart sounds.  Respiratory: Effort normal and breath sounds normal.  GI: Soft.  Musculoskeletal: Normal range of motion.        General: Edema present.  Neurological: She is alert and oriented to person, place, and time.  Skin: Skin is warm and dry.  Psychiatric: She has a normal mood and affect. Her behavior is normal.    Fetal Assessment 155 bpm, Mod Var, -Decels, +Accels Toco: Irregular  MAU  Course   Results for orders placed or performed during the hospital encounter of 04/06/19 (from the past 24 hour(s))  CBC     Status: Abnormal   Collection Time: 04/06/19  9:42 PM  Result Value Ref Range   WBC 13.9 (H) 4.0 - 10.5 K/uL   RBC 4.65 3.87 - 5.11 MIL/uL   Hemoglobin 10.9 (L) 12.0 - 15.0 g/dL   HCT 36.6 36.0 - 46.0 %   MCV 78.7 (L) 80.0 - 100.0 fL   MCH 23.4 (L) 26.0 - 34.0 pg   MCHC 29.8 (L) 30.0 - 36.0 g/dL   RDW 16.2 (H) 11.5 - 15.5 %   Platelets 373 150 - 400 K/uL   nRBC 0.0 0.0 - 0.2 %  Comprehensive metabolic panel     Status: Abnormal   Collection Time: 04/06/19  9:42 PM  Result Value Ref Range   Sodium 134 (L) 135 - 145 mmol/L   Potassium 3.8 3.5 - 5.1 mmol/L   Chloride 104 98 - 111 mmol/L   CO2 26 22 - 32 mmol/L   Glucose, Bld 129 (H) 70 - 99 mg/dL   BUN 9 6 - 20 mg/dL   Creatinine, Ser 0.93 0.44 - 1.00 mg/dL   Calcium 8.9 8.9 - 10.3 mg/dL   Total Protein 6.8 6.5 - 8.1 g/dL   Albumin 2.8 (L) 3.5 - 5.0 g/dL   AST 15 15 - 41 U/L   ALT 16 0 - 44 U/L   Alkaline Phosphatase 140 (H) 38 - 126 U/L   Total Bilirubin 0.3 0.3 - 1.2 mg/dL   GFR calc non Af Amer >60 >60 mL/min   GFR calc Af Amer >60 >60 mL/min   Anion gap 4 (L) 5 - 15  Protein / creatinine ratio, urine     Status: None   Collection Time: 04/06/19 10:07 PM  Result Value Ref Range   Creatinine, Urine 240.28 mg/dL   Total Protein, Urine 23 mg/dL   Protein Creatinine Ratio 0.10 0.00 - 0.15 mg/mg[Cre]   No results found.  MDM Physical Exam Labs: CBC, CMP, PC Ratio Measure BPQ15 min EFM  Assessment and Plan  28 year old G3P2002  SIUP at 37.2weeks Cat I FT Elevated BP   -Exam findings discussed. -While in room, provider noted patient with legs crossed and arm bent during  bp reading.  Instructed on proper body position to reliable reading. -Informed that would obtain labs and monitor bp to r/o PreEclampsia. -No questions or concerns. -NST reactive.  Follow Up (11:48 PM) Elevated BP    -Results return negative. -In room to discuss and patient in bed with arm bent and legs crossed.  -Discussed results and patient without questions or concerns.  -Informed of need for office visit for bp check as she has no cuff at home. -Informed that if bp remains elevated, would dx with GHTN and plan for IOL. -Message sent to office [facility] for scheduling. -Labor Precautions given including contractions, bleeding, leaking, and DFM. -Encouraged to call or return to MAU if symptoms worsen or with the onset of new symptoms. -Discharged to home in stable condition   Maryann Conners MSN, CNM 04/06/2019, 9:28 PM

## 2019-04-06 NOTE — MAU Note (Signed)
Patient presents to MAU c/o ctx that started around 1200. Patient at work when ctx started.Patient reports ctx about 2 mins apart.  Patient denies LOF, no vaginal bleeding, +FM.

## 2019-04-07 DIAGNOSIS — O99820 Streptococcus B carrier state complicating pregnancy: Secondary | ICD-10-CM | POA: Insufficient documentation

## 2019-04-07 DIAGNOSIS — O26893 Other specified pregnancy related conditions, third trimester: Secondary | ICD-10-CM | POA: Diagnosis not present

## 2019-04-07 NOTE — Discharge Instructions (Signed)
Fetal Movement Counts Patient Name: ________________________________________________ Patient Due Date: ____________________ What is a fetal movement count?  A fetal movement count is the number of times that you feel your baby move during a certain amount of time. This may also be called a fetal kick count. A fetal movement count is recommended for every pregnant woman. You may be asked to start counting fetal movements as early as week 28 of your pregnancy. Pay attention to when your baby is most active. You may notice your baby's sleep and wake cycles. You may also notice things that make your baby move more. You should do a fetal movement count:  When your baby is normally most active.  At the same time each day. A good time to count movements is while you are resting, after having something to eat and drink. How do I count fetal movements? 1. Find a quiet, comfortable area. Sit, or lie down on your side. 2. Write down the date, the start time and stop time, and the number of movements that you felt between those two times. Take this information with you to your health care visits. 3. For 2 hours, count kicks, flutters, swishes, rolls, and jabs. You should feel at least 10 movements during 2 hours. 4. You may stop counting after you have felt 10 movements. 5. If you do not feel 10 movements in 2 hours, have something to eat and drink. Then, keep resting and counting for 1 hour. If you feel at least 4 movements during that hour, you may stop counting. Contact a health care provider if:  You feel fewer than 4 movements in 2 hours.  Your baby is not moving like he or she usually does. Date: ____________ Start time: ____________ Stop time: ____________ Movements: ____________ Date: ____________ Start time: ____________ Stop time: ____________ Movements: ____________ Date: ____________ Start time: ____________ Stop time: ____________ Movements: ____________ Date: ____________ Start time:  ____________ Stop time: ____________ Movements: ____________ Date: ____________ Start time: ____________ Stop time: ____________ Movements: ____________ Date: ____________ Start time: ____________ Stop time: ____________ Movements: ____________ Date: ____________ Start time: ____________ Stop time: ____________ Movements: ____________ Date: ____________ Start time: ____________ Stop time: ____________ Movements: ____________ Date: ____________ Start time: ____________ Stop time: ____________ Movements: ____________ This information is not intended to replace advice given to you by your health care provider. Make sure you discuss any questions you have with your health care provider. Document Released: 11/16/2006 Document Revised: 06/15/2016 Document Reviewed: 11/26/2015 Elsevier Interactive Patient Education  2019 [employer] Braxton Hicks Contractions Contractions of the uterus can occur throughout pregnancy, but they are not always a sign that you are in labor. You may have practice contractions called Braxton Hicks contractions. These false labor contractions are sometimes confused with true labor. What are Braxton Hicks contractions? Braxton Hicks contractions are tightening movements that occur in the muscles of the uterus before labor. Unlike true labor contractions, these contractions do not result in opening (dilation) and thinning of the cervix. Toward the end of pregnancy (32-34 weeks), Braxton Hicks contractions can happen more often and may become stronger. These contractions are sometimes difficult to tell apart from true labor because they can be very uncomfortable. You should not feel embarrassed if you go to the hospital with false labor. Sometimes, the only way to tell if you are in true labor is for your health care provider to look for changes in the cervix. The health care provider will do a physical exam and may monitor your contractions. If   you are not in true labor, the exam  should show that your cervix is not dilating and your water has not broken. If there are no other health problems associated with your pregnancy, it is completely safe for you to be sent home with false labor. You may continue to have Braxton Hicks contractions until you go into true labor. How to tell the difference between true labor and false labor True labor  Contractions last 30-70 seconds.  Contractions become very regular.  Discomfort is usually felt in the top of the uterus, and it spreads to the lower abdomen and low back.  Contractions do not go away with walking.  Contractions usually become more intense and increase in frequency.  The cervix dilates and gets thinner. False labor  Contractions are usually shorter and not as strong as true labor contractions.  Contractions are usually irregular.  Contractions are often felt in the front of the lower abdomen and in the groin.  Contractions may go away when you walk around or change positions while lying down.  Contractions get weaker and are shorter-lasting as time goes on.  The cervix usually does not dilate or become thin. Follow these instructions at home:   Take over-the-counter and prescription medicines only as told by your health care provider.  Keep up with your usual exercises and follow other instructions from your health care provider.  Eat and drink lightly if you think you are going into labor.  If Braxton Hicks contractions are making you uncomfortable: ? Change your position from lying down or resting to walking, or change from walking to resting. ? Sit and rest in a tub of warm water. ? Drink enough fluid to keep your urine pale yellow. Dehydration may cause these contractions. ? Do slow and deep breathing several times an hour.  Keep all follow-up prenatal visits as told by your health care provider. This is important. Contact a health care provider if:  You have a fever.  You have continuous  pain in your abdomen. Get help right away if:  Your contractions become stronger, more regular, and closer together.  You have fluid leaking or gushing from your vagina.  You pass blood-tinged mucus (bloody show).  You have bleeding from your vagina.  You have low back pain that you never had before.  You feel your baby's head pushing down and causing pelvic pressure.  Your baby is not moving inside you as much as it used to. Summary  Contractions that occur before labor are called Braxton Hicks contractions, false labor, or practice contractions.  Braxton Hicks contractions are usually shorter, weaker, farther apart, and less regular than true labor contractions. True labor contractions usually become progressively stronger and regular, and they become more frequent.  Manage discomfort from Braxton Hicks contractions by changing position, resting in a warm bath, drinking plenty of water, or practicing deep breathing. This information is not intended to replace advice given to you by your health care provider. Make sure you discuss any questions you have with your health care provider. Document Released: 03/02/2017 Document Revised: 08/01/2017 Document Reviewed: 03/02/2017 Elsevier Interactive Patient Education  2019 [employer] Inc[employer]  

## 2019-04-08 ENCOUNTER — Encounter: Payer: Self-pay | Admitting: *Deleted

## 2019-04-10 ENCOUNTER — Ambulatory Visit (INDEPENDENT_AMBULATORY_CARE_PROVIDER_SITE_OTHER): Payer: BC Managed Care – PPO | Admitting: Obstetrics and Gynecology

## 2019-04-10 ENCOUNTER — Encounter: Payer: Self-pay | Admitting: Obstetrics and Gynecology

## 2019-04-10 ENCOUNTER — Other Ambulatory Visit: Payer: Self-pay

## 2019-04-10 VITALS — BP 108/64 | HR 111 | Wt 362.0 lb

## 2019-04-10 DIAGNOSIS — O34219 Maternal care for unspecified type scar from previous cesarean delivery: Secondary | ICD-10-CM

## 2019-04-10 DIAGNOSIS — O99820 Streptococcus B carrier state complicating pregnancy: Secondary | ICD-10-CM

## 2019-04-10 DIAGNOSIS — Z98891 History of uterine scar from previous surgery: Secondary | ICD-10-CM

## 2019-04-10 DIAGNOSIS — Z3A37 37 weeks gestation of pregnancy: Secondary | ICD-10-CM

## 2019-04-10 DIAGNOSIS — Z3480 Encounter for supervision of other normal pregnancy, unspecified trimester: Secondary | ICD-10-CM

## 2019-04-10 NOTE — Patient Instructions (Signed)

## 2019-04-10 NOTE — Progress Notes (Signed)
Subjective:  Paula Sandoval is a 28 y.o. G3P2002 at [redacted]w[redacted]d being seen today for ongoing prenatal care.  She is currently monitored for the following issues for this high-risk pregnancy and has Morbid obesity with BMI of 45.0-49.9, adult ([place]); Previous cesarean delivery, antepartum condition or complication; Encounter for supervision of normal pregnancy, antepartum; Group B streptococcal carriage complicating pregnancy; and History of VBAC on their problem list.  Patient reports general discomforts of pregnancy.  Contractions: Irregular. Vag. Bleeding: None.  Movement: Present. Denies leaking of fluid.   The following portions of the patient's history were reviewed and updated as appropriate: allergies, current medications, past family history, past medical history, past social history, past surgical history and problem list. Problem list updated.  Objective:   Vitals:   04/10/19 1058  BP: 108/64  Pulse: (!) 111  Weight: (!) 362 lb (164.2 kg)    Fetal Status:     Movement: Present     General:  Alert, oriented and cooperative. Patient is in no acute distress.  Skin: Skin is warm and dry. No rash noted.   Cardiovascular: Normal heart rate noted  Respiratory: Normal respiratory effort, no problems with respiration noted  Abdomen: Soft, gravid, appropriate for gestational age. Pain/Pressure: Present     Pelvic:  Cervical exam deferred        Extremities: Normal range of motion.     Mental Status: Normal mood and affect. Normal behavior. Normal judgment and thought content.   Urinalysis:      Assessment and Plan:  Pregnancy: G3P2002 at [redacted]w[redacted]d  1. Supervision of other normal pregnancy, antepartum Stable Labor precautions Was seen over weekend for increased BP, nl today. No S/Sx of PEC Continue to monitor BP S/Sx of PEC and indications to go to MAU reviewed with pt  Consider IOL at next appt between 39-40 weeks d/t LGA on last U/S and if cervix favorable Discussed with pt and she is  aware IOL is not certain  2. Group B streptococcal carriage complicating pregnancy Tx while in labor  3. History of VBAC   4. Previous cesarean delivery, antepartum condition or complication See above  Term labor symptoms and general obstetric precautions including but not limited to vaginal bleeding, contractions, leaking of fluid and fetal movement were reviewed in detail with the patient. Please refer to After Visit Summary for other counseling recommendations.  Return in about 1 week (around 04/17/2019) for OB visit, face to face.   Chancy Milroy, MD

## 2019-04-17 ENCOUNTER — Encounter: Payer: Self-pay | Admitting: Obstetrics & Gynecology

## 2019-04-17 ENCOUNTER — Other Ambulatory Visit: Payer: Self-pay

## 2019-04-17 ENCOUNTER — Ambulatory Visit (INDEPENDENT_AMBULATORY_CARE_PROVIDER_SITE_OTHER): Payer: BC Managed Care – PPO | Admitting: Obstetrics & Gynecology

## 2019-04-17 DIAGNOSIS — Z3A38 38 weeks gestation of pregnancy: Secondary | ICD-10-CM

## 2019-04-17 DIAGNOSIS — Z3480 Encounter for supervision of other normal pregnancy, unspecified trimester: Secondary | ICD-10-CM

## 2019-04-17 DIAGNOSIS — Z3483 Encounter for supervision of other normal pregnancy, third trimester: Secondary | ICD-10-CM

## 2019-04-17 NOTE — Progress Notes (Signed)
Patient reports fetal movement and some contractions.

## 2019-04-17 NOTE — Patient Instructions (Signed)
Vaginal Birth After Cesarean Delivery  Vaginal Delivery  Vaginal delivery means that you give birth by pushing your baby out of your birth canal (vagina). A team of health care providers will help you before, during, and after vaginal delivery. Birth experiences are unique for every woman and every pregnancy, and birth experiences vary depending on where you choose to give birth. What happens when I arrive at the birth center or hospital? Once you are in labor and have been admitted into the hospital or birth center, your health care provider may:  Review your pregnancy history and any concerns that you have.  Insert an IV into one of your veins. This may be used to give you fluids and medicines.  Check your blood pressure, pulse, temperature, and heart rate (vital signs).  Check whether your bag of water (amniotic sac) has broken (ruptured).  Talk with you about your birth plan and discuss pain control options. Monitoring Your health care provider may monitor your contractions (uterine monitoring) and your baby's heart rate (fetal monitoring). You may need to be monitored:  Often, but not continuously (intermittently).  All the time or for long periods at a time (continuously). Continuous monitoring may be needed if: ? You are taking certain medicines, such as medicine to relieve pain or make your contractions stronger. ? You have pregnancy or labor complications. Monitoring may be done by:  Placing a special stethoscope or a handheld monitoring device on your abdomen to check your baby's heartbeat and to check for contractions.  Placing monitors on your abdomen (external monitors) to record your baby's heartbeat and the frequency and length of contractions.  Placing monitors inside your uterus through your vagina (internal monitors) to record your baby's heartbeat and the frequency, length, and strength of your contractions. Depending on the type of monitor, it may remain in your  uterus or on your baby's head until birth.  Telemetry. This is a type of continuous monitoring that can be done with external or internal monitors. Instead of having to stay in bed, you are able to move around during telemetry. Physical exam Your health care provider may perform frequent physical exams. This may include:  Checking how and where your baby is positioned in your uterus.  Checking your cervix to determine: ? Whether it is thinning out (effacing). ? Whether it is opening up (dilating). What happens during labor and delivery?  Normal labor and delivery is divided into the following three stages: Stage 1  This is the longest stage of labor.  This stage can last for hours or days.  Throughout this stage, you will feel contractions. Contractions generally feel mild, infrequent, and irregular at first. They get stronger, more frequent (about every 2-3 minutes), and more regular as you move through this stage.  This stage ends when your cervix is completely dilated to 4 inches (10 cm) and completely effaced. Stage 2  This stage starts once your cervix is completely effaced and dilated and lasts until the delivery of your baby.  This stage may last from 20 minutes to 2 hours.  This is the stage where you will feel an urge to push your baby out of your vagina.  You may feel stretching and burning pain, especially when the widest part of your baby's head passes through the vaginal opening (crowning).  Once your baby is delivered, the umbilical cord will be clamped and cut. This usually occurs after waiting a period of 1-2 minutes after delivery.  Your baby will  be placed on your bare chest (skin-to-skin contact) in an upright position and covered with a warm blanket. Watch your baby for feeding cues, like rooting or sucking, and help the baby to your breast for his or her first feeding. Stage 3  This stage starts immediately after the birth of your baby and ends after you  deliver the placenta.  This stage may take anywhere from 5 to 30 minutes.  After your baby has been delivered, you will feel contractions as your body expels the placenta and your uterus contracts to control bleeding. What can I expect after labor and delivery?  After labor is over, you and your baby will be monitored closely until you are ready to go home to ensure that you are both healthy. Your health care team will teach you how to care for yourself and your baby.  You and your baby will stay in the same room (rooming in) during your hospital stay. This will encourage early bonding and successful breastfeeding.  You may continue to receive fluids and medicines through an IV.  Your uterus will be checked and massaged regularly (fundal massage).  You will have some soreness and pain in your abdomen, vagina, and the area of skin between your vaginal opening and your anus (perineum).  If an incision was made near your vagina (episiotomy) or if you had some vaginal tearing during delivery, cold compresses may be placed on your episiotomy or your tear. This helps to reduce pain and swelling.  You may be given a squirt bottle to use instead of wiping when you go to the bathroom. To use the squirt bottle, follow these steps: ? Before you urinate, fill the squirt bottle with warm water. Do not use hot water. ? After you urinate, while you are sitting on the toilet, use the squirt bottle to rinse the area around your urethra and vaginal opening. This rinses away any urine and blood. ? Fill the squirt bottle with clean water every time you use the bathroom.  It is normal to have vaginal bleeding after delivery. Wear a sanitary pad for vaginal bleeding and discharge. Summary  Vaginal delivery means that you will give birth by pushing your baby out of your birth canal (vagina).  Your health care provider may monitor your contractions (uterine monitoring) and your baby's heart rate (fetal  monitoring).  Your health care provider may perform a physical exam.  Normal labor and delivery is divided into three stages.  After labor is over, you and your baby will be monitored closely until you are ready to go home. This information is not intended to replace advice given to you by your health care provider. Make sure you discuss any questions you have with your health care provider. Document Released: 07/26/2008 Document Revised: 11/21/2017 Document Reviewed: 11/21/2017 Elsevier Interactive Patient Education  [employer].  Vaginal birth after cesarean delivery (VBAC) is giving birth vaginally after previously delivering a baby through a cesarean section (C-section). A VBAC may be a safe option for you, depending on your health and other factors. It is important to discuss VBAC with your health care provider early in your pregnancy so you can understand the risks, benefits, and options. Having these discussions early will give you time to make your birth plan. Who are the best candidates for VBAC? The best candidates for VBAC are women who:  Have had one or two prior cesarean deliveries, and the incision made during the delivery was horizontal (low transverse).  Do not have a vertical (classical) scar on their uterus.  Have not had a tear in the wall of their uterus (uterine rupture).  Plan to have more pregnancies. A VBAC is also more likely to be successful:  In women who have previously given birth vaginally.  When labor starts by itself (spontaneously) before the due date. What are the benefits of VBAC? The benefits of delivering your baby vaginally instead of by a cesarean delivery include:  A shorter hospital stay.  A faster recovery time.  Less pain.  Avoiding risks associated with major surgery, such as infection and blood clots.  Less blood loss and less need for donated blood (transfusions). What are the risks of VBAC? The main risk of attempting a  VBAC is that it may fail, forcing your health care provider to deliver your baby by a C-section. Other risks are rare and include:  Tearing (rupture) of the scar from a past cesarean delivery.  Other risks associated with vaginal deliveries. If a repeat cesarean delivery is needed, the risks include:  Blood loss.  Infection.  Blood clot.  Damage to surrounding organs.  Removal of the uterus (hysterectomy), if it is damaged.  Placenta problems in future pregnancies. What else should I know about my options? Delivering a baby through a VBAC is similar to having a normal spontaneous vaginal delivery. Therefore, it is safe:  To try with twins.  For your health care provider to try to turn the baby from a breech position (external cephalic version) during labor.  With epidural analgesia for pain relief. Consider where you would like to deliver your baby. VBAC should be attempted in facilities where an emergency cesarean delivery can be performed. VBAC is not recommended for home births. Any changes in your health or your baby's health during your pregnancy may make it necessary to change your initial decision about VBAC. Your health care provider may recommend that you do not attempt a VBAC if:  Your baby's suspected weight is 8.8 lb (4 kg) or more.  You have preeclampsia. This is a condition that causes high blood pressure along with other symptoms, such as swelling and headaches.  You will have VBAC less than 19 months after your cesarean delivery.  You are past your due date.  You need to have labor started (induced) because your cervix is not ready for labor (unfavorable). Where to find more information  American Pregnancy Association: americanpregnancy.org  [employer] of Obstetricians and Gynecologists: acog.org Summary  Vaginal birth after cesarean delivery (VBAC) is giving birth vaginally after previously delivering a baby through a cesarean section (C-section).  A VBAC may be a safe option for you, depending on your health and other factors.  Discuss VBAC with your health care provider early in your pregnancy so you can understand the risks, benefits, options, and have plenty of time to make your birth plan.  The main risk of attempting a VBAC is that it may fail, forcing your health care provider to deliver your baby by a C-section. Other risks are rare. This information is not intended to replace advice given to you by your health care provider. Make sure you discuss any questions you have with your health care provider. Document Released: 04/09/2007 Document Revised: 01/24/2017 Document Reviewed: 01/24/2017 Elsevier Interactive Patient Education  2019 [employer].

## 2019-04-17 NOTE — Progress Notes (Signed)
   PRENATAL VISIT NOTE  Subjective:  Paula Sandoval is a 28 y.o. G3P2002 at [redacted]w[redacted]d being seen today for ongoing prenatal care.  She is currently monitored for the following issues for this high-risk pregnancy and has Morbid obesity with BMI of 45.0-49.9, adult ([place]); Previous cesarean delivery, antepartum condition or complication; Encounter for supervision of normal pregnancy, antepartum; Group B streptococcal carriage complicating pregnancy; and History of VBAC on their problem list.  Patient reports occasional contractions.  Contractions: Irregular. Vag. Bleeding: None.  Movement: Present. Denies leaking of fluid.   The following portions of the patient's history were reviewed and updated as appropriate: allergies, current medications, past family history, past medical history, past social history, past surgical history and problem list.   Objective:   Vitals:   04/17/19 1031  BP: 137/80  Pulse: (!) 109  Temp: (!) 97.3 F (36.3 C)  Weight: (!) 358 lb 14.4 oz (162.8 kg)    Fetal Status: Fetal Heart Rate (bpm): 151   Movement: Present  Presentation: Vertex  General:  Alert, oriented and cooperative. Patient is in no acute distress.  Skin: Skin is warm and dry. No rash noted.   Cardiovascular: Normal heart rate noted  Respiratory: Normal respiratory effort, no problems with respiration noted  Abdomen: Soft, gravid, appropriate for gestational age.  Pain/Pressure: Present     Pelvic: Cervical exam performed Dilation: 2 Effacement (%): 30 Station: -3  Extremities: Normal range of motion.  Edema: Trace  Mental Status: Normal mood and affect. Normal behavior. Normal judgment and thought content.   Assessment and Plan:  Pregnancy: G3P2002 at [redacted]w[redacted]d 1. Supervision of other normal pregnancy, antepartum Possible LGA by [place] 37 weeks TOLAC Term labor symptoms and general obstetric precautions including but not limited to vaginal bleeding, contractions, leaking of fluid and fetal movement  were reviewed in detail with the patient. Please refer to After Visit Summary for other counseling recommendations.   Return in about 1 week (around 04/24/2019).  No future appointments.  Emeterio Reeve, MD

## 2019-04-18 ENCOUNTER — Telehealth: Payer: Self-pay

## 2019-04-18 NOTE — Telephone Encounter (Signed)
Pt experienced HA last night and sent BP readings through Mychart last night. Consulted with provider, BP readings were elevated. Pt still has slight HA this morning. Pt to report to [facility].

## 2019-04-19 ENCOUNTER — Other Ambulatory Visit: Payer: Self-pay

## 2019-04-19 ENCOUNTER — Inpatient Hospital Stay (HOSPITAL_COMMUNITY)
Admission: AD | Admit: 2019-04-19 | Discharge: 2019-04-19 | Disposition: A | Discharge status: Home / Self Care | Payer: BC Managed Care – PPO | Attending: Obstetrics & Gynecology | Admitting: Obstetrics & Gynecology

## 2019-04-19 ENCOUNTER — Encounter (HOSPITAL_COMMUNITY): Payer: Self-pay

## 2019-04-19 DIAGNOSIS — R030 Elevated blood-pressure reading, without diagnosis of hypertension: Secondary | ICD-10-CM | POA: Insufficient documentation

## 2019-04-19 DIAGNOSIS — O26893 Other specified pregnancy related conditions, third trimester: Secondary | ICD-10-CM | POA: Diagnosis not present

## 2019-04-19 DIAGNOSIS — O163 Unspecified maternal hypertension, third trimester: Secondary | ICD-10-CM | POA: Insufficient documentation

## 2019-04-19 DIAGNOSIS — Z3A39 39 weeks gestation of pregnancy: Secondary | ICD-10-CM | POA: Diagnosis not present

## 2019-04-19 LAB — PROTEIN / CREATININE RATIO, URINE
Creatinine, Urine: 277.5 mg/dL
Protein Creatinine Ratio: 0.1 mg/mg{Cre} (ref 0.00–0.15)
Total Protein, Urine: 28 mg/dL

## 2019-04-19 LAB — URINALYSIS, ROUTINE W REFLEX MICROSCOPIC
Bacteria, UA: NONE SEEN
Bilirubin Urine: NEGATIVE
Glucose, UA: NEGATIVE mg/dL
Hgb urine dipstick: NEGATIVE
Ketones, ur: NEGATIVE mg/dL
Nitrite: NEGATIVE
Protein, ur: 30 mg/dL — AB
Specific Gravity, Urine: 1.03 (ref 1.005–1.030)
pH: 6 (ref 5.0–8.0)

## 2019-04-19 LAB — CBC
HCT: 36.8 % (ref 36.0–46.0)
Hemoglobin: 11 g/dL — ABNORMAL LOW (ref 12.0–15.0)
MCH: 23.3 pg — ABNORMAL LOW (ref 26.0–34.0)
MCHC: 29.9 g/dL — ABNORMAL LOW (ref 30.0–36.0)
MCV: 78 fL — ABNORMAL LOW (ref 80.0–100.0)
Platelets: 398 10*3/uL (ref 150–400)
RBC: 4.72 MIL/uL (ref 3.87–5.11)
RDW: 16.5 % — ABNORMAL HIGH (ref 11.5–15.5)
WBC: 13.3 10*3/uL — ABNORMAL HIGH (ref 4.0–10.5)
nRBC: 0 % (ref 0.0–0.2)

## 2019-04-19 LAB — COMPREHENSIVE METABOLIC PANEL
ALT: 18 U/L (ref 0–44)
AST: 16 U/L (ref 15–41)
Albumin: 2.8 g/dL — ABNORMAL LOW (ref 3.5–5.0)
Alkaline Phosphatase: 155 U/L — ABNORMAL HIGH (ref 38–126)
Anion gap: 9 (ref 5–15)
BUN: 7 mg/dL (ref 6–20)
CO2: 21 mmol/L — ABNORMAL LOW (ref 22–32)
Calcium: 8.6 mg/dL — ABNORMAL LOW (ref 8.9–10.3)
Chloride: 105 mmol/L (ref 98–111)
Creatinine, Ser: 0.79 mg/dL (ref 0.44–1.00)
GFR calc Af Amer: >60 mL/min (ref >60)
GFR calc non Af Amer: >60 mL/min (ref >60)
Glucose, Bld: 87 mg/dL (ref 70–99)
Potassium: 4.1 mmol/L (ref 3.5–5.1)
Sodium: 135 mmol/L (ref 135–145)
Total Bilirubin: 0.4 mg/dL (ref 0.3–1.2)
Total Protein: 6.6 g/dL (ref 6.5–8.1)

## 2019-04-19 NOTE — Discharge Instructions (Signed)
Fetal Movement Counts  Patient Name: ________________________________________________ Patient Due Date: ____________________  What is a fetal movement count?    A fetal movement count is the number of times that you feel your baby move during a certain amount of time. This may also be called a fetal kick count. A fetal movement count is recommended for every pregnant woman. You may be asked to start counting fetal movements as early as week 28 of your pregnancy.  Pay attention to when your baby is most active. You may notice your baby's sleep and wake cycles. You may also notice things that make your baby move more. You should do a fetal movement count:  · When your baby is normally most active.  · At the same time each day.  A good time to count movements is while you are resting, after having something to eat and drink.  How do I count fetal movements?  1. Find a quiet, comfortable area. Sit, or lie down on your side.  2. Write down the date, the start time and stop time, and the number of movements that you felt between those two times. Take this information with you to your health care visits.  3. For 2 hours, count kicks, flutters, swishes, rolls, and jabs. You should feel at least 10 movements during 2 hours.  4. You may stop counting after you have felt 10 movements.  5. If you do not feel 10 movements in 2 hours, have something to eat and drink. Then, keep resting and counting for 1 hour. If you feel at least 4 movements during that hour, you may stop counting.  Contact a health care provider if:  · You feel fewer than 4 movements in 2 hours.  · Your baby is not moving like he or she usually does.  Date: ____________ Start time: ____________ Stop time: ____________ Movements: ____________  Date: ____________ Start time: ____________ Stop time: ____________ Movements: ____________  Date: ____________ Start time: ____________ Stop time: ____________ Movements: ____________  Date: ____________ Start time:  ____________ Stop time: ____________ Movements: ____________  Date: ____________ Start time: ____________ Stop time: ____________ Movements: ____________  Date: ____________ Start time: ____________ Stop time: ____________ Movements: ____________  Date: ____________ Start time: ____________ Stop time: ____________ Movements: ____________  Date: ____________ Start time: ____________ Stop time: ____________ Movements: ____________  Date: ____________ Start time: ____________ Stop time: ____________ Movements: ____________  This information is not intended to replace advice given to you by your health care provider. Make sure you discuss any questions you have with your health care provider.  Document Released: 11/16/2006 Document Revised: 06/15/2016 Document Reviewed: 11/26/2015  Elsevier Interactive Patient Education © 2019 Elsevier Inc.  Hypertension During Pregnancy    Hypertension, commonly called high blood pressure, is when the force of blood pumping through your arteries is too strong. Arteries are blood vessels that carry blood from the heart throughout the body. Hypertension during pregnancy can cause problems for you and your baby. Your baby may be born early (prematurely) or may not weigh as much as he or she should at birth. Very bad cases of hypertension during pregnancy can be life-threatening.  Different types of hypertension can occur during pregnancy. These include:  · Chronic hypertension. This happens when:  ? You have hypertension before pregnancy and it continues during pregnancy.  ? You develop hypertension before you are [redacted] weeks pregnant, and it continues during pregnancy.  · Gestational hypertension. This is hypertension that develops after the 20th week of   pregnancy.  · Preeclampsia, also called toxemia of pregnancy. This is a very serious type of hypertension that develops during pregnancy. It can be very dangerous for you and your baby.  ? In rare cases, you may develop preeclampsia after  giving birth (postpartum preeclampsia). This usually occurs within 48 hours after childbirth but may occur up to 6 weeks after giving birth.  Gestational hypertension and preeclampsia usually go away within 6 weeks after your baby is born. Women who have hypertension during pregnancy have a greater chance of developing hypertension later in life or during future pregnancies.  What are the causes?  The exact cause of hypertension during pregnancy is not known.  What increases the risk?  There are certain factors that make it more likely for you to develop hypertension during pregnancy. These include:  · Having hypertension during a previous pregnancy or prior to pregnancy.  · Being overweight.  · Being age 35 or older.  · Being pregnant for the first time.  · Being pregnant with more than one baby.  · Becoming pregnant using fertilization methods such as IVF (in vitro fertilization).  · Having diabetes, kidney problems, or systemic lupus erythematosus.  · Having a family history of hypertension.  What are the signs or symptoms?  Chronic hypertension and gestational hypertension rarely cause symptoms. Preeclampsia causes symptoms, which may include:  · Increased protein in your urine. Your health care provider will check for this at every visit before you give birth (prenatal visit).  · Severe headaches.  · Sudden weight gain.  · Swelling of the hands, face, legs, and feet.  · Nausea and vomiting.  · Vision problems, such as blurred or double vision.  · Numbness in the face, arms, legs, and feet.  · Dizziness.  · Slurred speech.  · Sensitivity to bright lights.  · Abdominal pain.  · Convulsions or seizures.  How is this diagnosed?  You may be diagnosed with hypertension during a routine prenatal exam. At each prenatal visit, you may:  · Have a urine test to check for high amounts of protein in your urine.  · Have your blood pressure checked. A blood pressure reading is given as two numbers, such as "120 over 80" (or  120/80). The first ("top") number is a measure of the pressure in your arteries when your heart beats (systolic pressure). The second ("bottom") number is a measure of the pressure in your arteries as your heart relaxes between beats (diastolic pressure). Blood pressure is measured in a unit called mm Hg. For most women, a normal blood pressure reading is:  ? Systolic: below 120.  ? Diastolic: below 80.  The type of hypertension that you are diagnosed with depends on your test results and when your symptoms developed.  · Chronic hypertension is usually diagnosed before 20 weeks of pregnancy.  · Gestational hypertension is usually diagnosed after 20 weeks of pregnancy.  · Hypertension with high amounts of protein in the urine is diagnosed as preeclampsia.  · Blood pressure measurements that stay above 160 systolic, or above 110 diastolic, are signs of severe preeclampsia.  How is this treated?  Treatment for hypertension during pregnancy varies depending on the type of hypertension you have and how serious it is.  · If you take medicines called ACE inhibitors to treat chronic hypertension, you may need to switch medicines. ACE inhibitors should not be taken during pregnancy.  · If you have gestational hypertension, you may need to take blood pressure medicine.  ·   If you are at risk for preeclampsia, your health care provider may recommend that you take a low-dose aspirin during your pregnancy.  · If you have severe preeclampsia, you may need to be hospitalized so you and your baby can be monitored closely. You may also need to take medicine (magnesium sulfate) to prevent seizures and to lower blood pressure. This medicine may be given as an injection or through an IV.  · In some cases, if your condition gets worse, you may need to deliver your baby early.  Follow these instructions at home:  Eating and drinking    · Drink enough fluid to keep your urine pale yellow.  · Avoid caffeine.  Lifestyle  · Do not use any  products that contain nicotine or tobacco, such as cigarettes and e-cigarettes. If you need help quitting, ask your health care provider.  · Do not use alcohol or drugs.  · Avoid stress as much as possible. Rest and get plenty of sleep.  General instructions  · Take over-the-counter and prescription medicines only as told by your health care provider.  · While lying down, lie on your left side. This keeps pressure off your major blood vessels.  · While sitting or lying down, raise (elevate) your feet. Try putting some pillows under your lower legs.  · Exercise regularly. Ask your health care provider what kinds of exercise are best for you.  · Keep all prenatal and follow-up visits as told by your health care provider. This is important.  Contact a health care provider if:  · You have symptoms that your health care provider told you may require more treatment or monitoring, such as:  ? Nausea or vomiting.  ? Headache.  Get help right away if you have:  · Severe abdominal pain that does not get better with treatment.  · A severe headache that does not get better.  · Vomiting that does not get better.  · Sudden, rapid weight gain.  · Sudden swelling in your hands, ankles, or face.  · Vaginal bleeding.  · Blood in your urine.  · Fewer movements from your baby than usual.  · Blurred or double vision.  · Muscle twitching or sudden muscle tightening (spasms).  · Shortness of breath.  · Blue fingernails or lips.  Summary  · Hypertension, commonly called high blood pressure, is when the force of blood pumping through your arteries is too strong.  · Hypertension during pregnancy can cause problems for you and your baby.  · Treatment for hypertension during pregnancy varies depending on the type of hypertension you have and how serious it is.  · Get help right away if you have symptoms that your health care provider told you to watch for.  This information is not intended to replace advice given to you by your health care  provider. Make sure you discuss any questions you have with your health care provider.  Document Released: 07/05/2011 Document Revised: 10/03/2017 Document Reviewed: 04/01/2016  Elsevier Interactive Patient Education © 2019 Elsevier Inc.

## 2019-04-19 NOTE — MAU Provider Note (Signed)
Chief Complaint  Patient presents with  . Hypertension     First Provider Initiated Contact with Patient 04/19/19 2010      S: Paula Sandoval  is a 28 y.o. y.o. year old G52P2002 female at [redacted]w[redacted]d weeks gestation who presents to MAU with elevated blood pressures. Denies Hx of hypertension. Current blood pressure medication: n/a.  Was seen in MAU last week for elevated BPs but had normal labs. Had f/u in office & was normotensive.  Has BP cuff at home; has to use it on her forearm due to size. States that since Wednesday evening her BPs have been elevated (130s-140s/90s-100s).  Has been having some headaches off & on. Denies headache today. Denies visual disturbance or epigastric pain.  Denies contractions, LOF, or vaginal bleeding. Good fetal movement.   O:  Patient Vitals for the past 24 hrs:  BP Temp Pulse Resp SpO2 Height Weight  04/19/19 2116 127/68 - (!) 111 - - - -  04/19/19 2046 119/73 - (!) 103 - - - -  04/19/19 2015 109/79 - (!) 104 - - - -  04/19/19 2000 104/73 - (!) 104 - - - -  04/19/19 1945 114/71 - (!) 107 - - - -  04/19/19 1926 124/78 98.1 F (36.7 C) (!) 119 20 97 % - -  04/19/19 1918 - - - - - 5\' 9"  (1.753 m) (!) 161.6 kg   General: NAD Heart: Regular rate Lungs: Normal rate and effort Abd: Soft, NT, Gravid, S=D Extremities: Non pitting Pedal edema Neuro: 2+ deep tendon reflexes, No clonus Pelvic: NEFG, no bleeding or LOF.     NST:  Baseline: 145 bpm, Variability: Good {> 6 bpm), Accelerations: Reactive and Decelerations: Absent  Results for orders placed or performed during the hospital encounter of 04/19/19 (from the past 24 hour(s))  CBC     Status: Abnormal   Collection Time: 04/19/19  7:56 PM  Result Value Ref Range   WBC 13.3 (H) 4.0 - 10.5 K/uL   RBC 4.72 3.87 - 5.11 MIL/uL   Hemoglobin 11.0 (L) 12.0 - 15.0 g/dL   HCT 36.8 36.0 - 46.0 %   MCV 78.0 (L) 80.0 - 100.0 fL   MCH 23.3 (L) 26.0 - 34.0 pg   MCHC 29.9 (L) 30.0 - 36.0 g/dL   RDW 16.5 (H)  11.5 - 15.5 %   Platelets 398 150 - 400 K/uL   nRBC 0.0 0.0 - 0.2 %  Comprehensive metabolic panel     Status: Abnormal   Collection Time: 04/19/19  7:56 PM  Result Value Ref Range   Sodium 135 135 - 145 mmol/L   Potassium 4.1 3.5 - 5.1 mmol/L   Chloride 105 98 - 111 mmol/L   CO2 21 (L) 22 - 32 mmol/L   Glucose, Bld 87 70 - 99 mg/dL   BUN 7 6 - 20 mg/dL   Creatinine, Ser 0.79 0.44 - 1.00 mg/dL   Calcium 8.6 (L) 8.9 - 10.3 mg/dL   Total Protein 6.6 6.5 - 8.1 g/dL   Albumin 2.8 (L) 3.5 - 5.0 g/dL   AST 16 15 - 41 U/L   ALT 18 0 - 44 U/L   Alkaline Phosphatase 155 (H) 38 - 126 U/L   Total Bilirubin 0.4 0.3 - 1.2 mg/dL   GFR calc non Af Amer >60 >60 mL/min   GFR calc Af Amer >60 >60 mL/min   Anion gap 9 5 - 15  Urinalysis, Routine w reflex microscopic  Status: Abnormal   Collection Time: 04/19/19  8:36 PM  Result Value Ref Range   Color, Urine YELLOW YELLOW   APPearance CLEAR CLEAR   Specific Gravity, Urine 1.030 1.005 - 1.030   pH 6.0 5.0 - 8.0   Glucose, UA NEGATIVE NEGATIVE mg/dL   Hgb urine dipstick NEGATIVE NEGATIVE   Bilirubin Urine NEGATIVE NEGATIVE   Ketones, ur NEGATIVE NEGATIVE mg/dL   Protein, ur 30 (A) NEGATIVE mg/dL   Nitrite NEGATIVE NEGATIVE   Leukocytes,Ua TRACE (A) NEGATIVE   RBC / HPF 0-5 0 - 5 RBC/hpf   WBC, UA 0-5 0 - 5 WBC/hpf   Bacteria, UA NONE SEEN NONE SEEN   Squamous Epithelial / LPF 6-10 0 - 5   Mucus PRESENT   Protein / creatinine ratio, urine     Status: None   Collection Time: 04/19/19  8:36 PM  Result Value Ref Range   Creatinine, Urine 277.50 mg/dL   Total Protein, Urine 28 mg/dL   Protein Creatinine Ratio 0.10 0.00 - 0.15 mg/mg[Cre]    A: [redacted]w[redacted]d week IUP 1. Elevated BP without diagnosis of hypertension   2. [redacted] weeks gestation of pregnancy   -pt normotensive during MAU stay -PEC labs normal    P:  Discharge home in stable condition per consult with Woodroe Mode, MD. Preeclampsia precautions. Keep scheduled ob appt on  Tuesday -- recommend to take BP cuff with her to office for comparison Return to maternity admissions as needed in emergencies  Jorje Guild, NP 04/19/2019 9:28 PM

## 2019-04-19 NOTE — MAU Note (Signed)
PT here for HBP. Reports that they have been 140s/90's today and was told to come in. Pt reports she has had HA but does not currently have one. Denies vision changes or RUQ pain. Reports decreased fetal movement. Reports occasional mild contractions. Denies vaginal bleeding or LOF.

## 2019-04-23 ENCOUNTER — Other Ambulatory Visit: Payer: Self-pay

## 2019-04-23 ENCOUNTER — Ambulatory Visit (INDEPENDENT_AMBULATORY_CARE_PROVIDER_SITE_OTHER): Payer: BC Managed Care – PPO | Admitting: Obstetrics and Gynecology

## 2019-04-23 ENCOUNTER — Encounter: Payer: Self-pay | Admitting: Obstetrics and Gynecology

## 2019-04-23 VITALS — BP 118/78 | HR 109 | Wt 356.2 lb

## 2019-04-23 DIAGNOSIS — Z3A39 39 weeks gestation of pregnancy: Secondary | ICD-10-CM

## 2019-04-23 DIAGNOSIS — O99820 Streptococcus B carrier state complicating pregnancy: Secondary | ICD-10-CM

## 2019-04-23 DIAGNOSIS — O34219 Maternal care for unspecified type scar from previous cesarean delivery: Secondary | ICD-10-CM

## 2019-04-23 DIAGNOSIS — Z98891 History of uterine scar from previous surgery: Secondary | ICD-10-CM

## 2019-04-23 DIAGNOSIS — O99213 Obesity complicating pregnancy, third trimester: Secondary | ICD-10-CM

## 2019-04-23 DIAGNOSIS — Z3480 Encounter for supervision of other normal pregnancy, unspecified trimester: Secondary | ICD-10-CM

## 2019-04-23 NOTE — Progress Notes (Signed)
Pt is here for ROB. [redacted]w[redacted]d.

## 2019-04-23 NOTE — Progress Notes (Signed)
   PRENATAL VISIT NOTE  Subjective:  Paula Sandoval is a 28 y.o. G3P2002 at [redacted]w[redacted]d being seen today for ongoing prenatal care.  She is currently monitored for the following issues for this high-risk pregnancy and has Morbid obesity with BMI of 45.0-49.9, adult ([place]); Previous cesarean delivery, antepartum condition or complication; Encounter for supervision of normal pregnancy, antepartum; Group B streptococcal carriage complicating pregnancy; and History of VBAC on their problem list.  Patient reports no complaints.  Contractions: Irregular. Vag. Bleeding: None.  Movement: Present. Denies leaking of fluid.   The following portions of the patient's history were reviewed and updated as appropriate: allergies, current medications, past family history, past medical history, past social history, past surgical history and problem list.   Objective:   Vitals:   04/23/19 1313  BP: 118/78  Pulse: (!) 109  Weight: (!) 356 lb 3.2 oz (161.6 kg)    Fetal Status: Fetal Heart Rate (bpm): 145 Fundal Height: 42 cm Movement: Present  Presentation: Vertex  General:  Alert, oriented and cooperative. Patient is in no acute distress.  Skin: Skin is warm and dry. No rash noted.   Cardiovascular: Normal heart rate noted  Respiratory: Normal respiratory effort, no problems with respiration noted  Abdomen: Soft, gravid, appropriate for gestational age.  Pain/Pressure: Present     Pelvic: Cervical exam performed Dilation: 2 Effacement (%): 30 Station: -3  Extremities: Normal range of motion.  Edema: Trace  Mental Status: Normal mood and affect. Normal behavior. Normal judgment and thought content.   Assessment and Plan:  Pregnancy: G3P2002 at [redacted]w[redacted]d 1. Supervision of other normal pregnancy, antepartum Patient is doing well without complaints Patient scheduled for IOL at 41 weeks Postdate NST next visit  2. Previous cesarean delivery, antepartum condition or complication Desires another TOLAC Consent  signed today  3. History of VBAC   4. Group B streptococcal carriage complicating pregnancy Prophylaxis in labor  5. Morbid obesity with BMI of 45.0-49.9, adult ([facility])   Term labor symptoms and general obstetric precautions including but not limited to vaginal bleeding, contractions, leaking of fluid and fetal movement were reviewed in detail with the patient. Please refer to After Visit Summary for other counseling recommendations.   Return in about 1 week (around 04/30/2019) for in person , ROB, NST.  Future Appointments  Date Time Provider [place]  04/29/2019  9:00 AM Woodroe Mode, MD [facility] None    Mora Bellman, MD

## 2019-04-26 ENCOUNTER — Telehealth (HOSPITAL_COMMUNITY): Payer: Self-pay

## 2019-04-26 ENCOUNTER — Encounter (HOSPITAL_COMMUNITY): Payer: Self-pay

## 2019-04-26 NOTE — Telephone Encounter (Signed)
Preadmission screen  

## 2019-04-28 ENCOUNTER — Inpatient Hospital Stay (HOSPITAL_COMMUNITY): Payer: BC Managed Care – PPO | Admitting: Anesthesiology

## 2019-04-28 ENCOUNTER — Encounter (HOSPITAL_COMMUNITY): Payer: Self-pay

## 2019-04-28 ENCOUNTER — Other Ambulatory Visit: Payer: Self-pay

## 2019-04-28 ENCOUNTER — Inpatient Hospital Stay (HOSPITAL_COMMUNITY)
Admission: AD | Admit: 2019-04-28 | Discharge: 2019-05-01 | DRG: 784 | Disposition: A | Discharge status: Home / Self Care | Payer: BC Managed Care – PPO | Attending: Obstetrics and Gynecology | Admitting: Obstetrics and Gynecology

## 2019-04-28 ENCOUNTER — Encounter (HOSPITAL_COMMUNITY)
Admission: AD | Disposition: A | Discharge status: Home / Self Care | Payer: Self-pay | Source: Home / Self Care | Attending: Obstetrics and Gynecology

## 2019-04-28 DIAGNOSIS — O34219 Maternal care for unspecified type scar from previous cesarean delivery: Secondary | ICD-10-CM | POA: Diagnosis present

## 2019-04-28 DIAGNOSIS — O99214 Obesity complicating childbirth: Secondary | ICD-10-CM | POA: Diagnosis present

## 2019-04-28 DIAGNOSIS — Z3A40 40 weeks gestation of pregnancy: Secondary | ICD-10-CM

## 2019-04-28 DIAGNOSIS — O99824 Streptococcus B carrier state complicating childbirth: Secondary | ICD-10-CM | POA: Diagnosis present

## 2019-04-28 DIAGNOSIS — O9081 Anemia of the puerperium: Secondary | ICD-10-CM | POA: Diagnosis not present

## 2019-04-28 DIAGNOSIS — O4292 Full-term premature rupture of membranes, unspecified as to length of time between rupture and onset of labor: Principal | ICD-10-CM | POA: Diagnosis present

## 2019-04-28 DIAGNOSIS — Z87891 Personal history of nicotine dependence: Secondary | ICD-10-CM

## 2019-04-28 DIAGNOSIS — O9952 Diseases of the respiratory system complicating childbirth: Secondary | ICD-10-CM | POA: Diagnosis present

## 2019-04-28 DIAGNOSIS — Z1159 Encounter for screening for other viral diseases: Secondary | ICD-10-CM | POA: Diagnosis not present

## 2019-04-28 DIAGNOSIS — D62 Acute posthemorrhagic anemia: Secondary | ICD-10-CM | POA: Diagnosis not present

## 2019-04-28 DIAGNOSIS — O34211 Maternal care for low transverse scar from previous cesarean delivery: Secondary | ICD-10-CM

## 2019-04-28 DIAGNOSIS — Z302 Encounter for sterilization: Secondary | ICD-10-CM | POA: Diagnosis not present

## 2019-04-28 DIAGNOSIS — O99820 Streptococcus B carrier state complicating pregnancy: Secondary | ICD-10-CM

## 2019-04-28 DIAGNOSIS — O26893 Other specified pregnancy related conditions, third trimester: Secondary | ICD-10-CM | POA: Diagnosis present

## 2019-04-28 DIAGNOSIS — Z880 Allergy status to penicillin: Secondary | ICD-10-CM | POA: Diagnosis not present

## 2019-04-28 DIAGNOSIS — Z3009 Encounter for other general counseling and advice on contraception: Secondary | ICD-10-CM | POA: Diagnosis present

## 2019-04-28 DIAGNOSIS — Z98891 History of uterine scar from previous surgery: Secondary | ICD-10-CM

## 2019-04-28 DIAGNOSIS — J45909 Unspecified asthma, uncomplicated: Secondary | ICD-10-CM | POA: Diagnosis present

## 2019-04-28 DIAGNOSIS — O480 Post-term pregnancy: Secondary | ICD-10-CM

## 2019-04-28 LAB — DIC (DISSEMINATED INTRAVASCULAR COAGULATION)PANEL
D-Dimer, Quant: 3.42 ug/mL-FEU — ABNORMAL HIGH (ref 0.00–0.50)
Fibrinogen: 457 mg/dL (ref 210–475)
INR: 1.1 (ref 0.8–1.2)
Platelets: 311 10*3/uL (ref 150–400)
Prothrombin Time: 14.5 seconds (ref 11.4–15.2)
Smear Review: NONE SEEN
aPTT: 27 seconds (ref 24–36)

## 2019-04-28 LAB — CBC
HCT: 37.8 % (ref 36.0–46.0)
HCT: 30.6 % — ABNORMAL LOW (ref 36.0–46.0)
Hemoglobin: 11.3 g/dL — ABNORMAL LOW (ref 12.0–15.0)
Hemoglobin: 9.3 g/dL — ABNORMAL LOW (ref 12.0–15.0)
MCH: 23.3 pg — ABNORMAL LOW (ref 26.0–34.0)
MCH: 23.5 pg — ABNORMAL LOW (ref 26.0–34.0)
MCHC: 29.9 g/dL — ABNORMAL LOW (ref 30.0–36.0)
MCHC: 30.4 g/dL (ref 30.0–36.0)
MCV: 77.9 fL — ABNORMAL LOW (ref 80.0–100.0)
MCV: 77.5 fL — ABNORMAL LOW (ref 80.0–100.0)
Platelets: 385 10*3/uL (ref 150–400)
Platelets: 293 10*3/uL (ref 150–400)
RBC: 4.85 MIL/uL (ref 3.87–5.11)
RBC: 3.95 MIL/uL (ref 3.87–5.11)
RDW: 17 % — ABNORMAL HIGH (ref 11.5–15.5)
RDW: 17.1 % — ABNORMAL HIGH (ref 11.5–15.5)
WBC: 12.9 10*3/uL — ABNORMAL HIGH (ref 4.0–10.5)
WBC: 28.4 10*3/uL — ABNORMAL HIGH (ref 4.0–10.5)
nRBC: 0 % (ref 0.0–0.2)
nRBC: 0 % (ref 0.0–0.2)

## 2019-04-28 LAB — PREPARE RBC (CROSSMATCH)

## 2019-04-28 LAB — SARS CORONAVIRUS 2 BY RT PCR (HOSPITAL ORDER, PERFORMED IN ~~LOC~~ HOSPITAL LAB): SARS Coronavirus 2: NEGATIVE

## 2019-04-28 LAB — CREATININE, SERUM
Creatinine, Ser: 0.82 mg/dL (ref 0.44–1.00)
GFR calc Af Amer: >60 mL/min (ref >60)
GFR calc non Af Amer: >60 mL/min (ref >60)

## 2019-04-28 LAB — POCT FERN TEST: POCT Fern Test: POSITIVE

## 2019-04-28 SURGERY — Surgical Case
Anesthesia: Epidural | Wound class: Clean Contaminated

## 2019-04-28 MED ORDER — SODIUM CHLORIDE 0.9 % IV SOLN
500.0000 mg | Freq: Once | INTRAVENOUS | Status: AC
  Administered 2019-04-28: 500 mg via INTRAVENOUS
  Filled 2019-04-28: qty 500

## 2019-04-28 MED ORDER — PHENYLEPHRINE 40 MCG/ML (10ML) SYRINGE FOR IV PUSH (FOR BLOOD PRESSURE SUPPORT)
PREFILLED_SYRINGE | INTRAVENOUS | Status: AC
  Filled 2019-04-28: qty 20

## 2019-04-28 MED ORDER — LIDOCAINE HCL (PF) 1 % IJ SOLN: 30.0000 mL | INTRAMUSCULAR | Status: DC | PRN

## 2019-04-28 MED ORDER — ONDANSETRON HCL 4 MG/2ML IJ SOLN: 4.0000 mg | Freq: Four times a day (QID) | INTRAMUSCULAR | Status: DC | PRN

## 2019-04-28 MED ORDER — DEXTROSE 5 % IV SOLN
INTRAVENOUS | Status: AC
  Filled 2019-04-28: qty 3000

## 2019-04-28 MED ORDER — OXYCODONE HCL 5 MG PO TABS: 5.0000 mg | ORAL_TABLET | Freq: Once | ORAL | Status: DC | PRN

## 2019-04-28 MED ORDER — KETOROLAC TROMETHAMINE 30 MG/ML IJ SOLN
INTRAMUSCULAR | Status: AC
  Filled 2019-04-28: qty 1

## 2019-04-28 MED ORDER — SODIUM CHLORIDE 0.9% FLUSH: 3.0000 mL | INTRAVENOUS | Status: DC | PRN

## 2019-04-28 MED ORDER — DIPHENHYDRAMINE HCL 25 MG PO CAPS: 25.0000 mg | ORAL_CAPSULE | Freq: Four times a day (QID) | ORAL | Status: DC | PRN

## 2019-04-28 MED ORDER — MEPERIDINE HCL 25 MG/ML IJ SOLN
INTRAMUSCULAR | Status: AC
  Filled 2019-04-28: qty 1

## 2019-04-28 MED ORDER — ONDANSETRON HCL 4 MG/2ML IJ SOLN
INTRAMUSCULAR | Status: DC | PRN
  Administered 2019-04-28: 4 mg via INTRAVENOUS

## 2019-04-28 MED ORDER — SOD CITRATE-CITRIC ACID 500-334 MG/5ML PO SOLN
30.0000 mL | ORAL | Status: DC | PRN
  Administered 2019-04-28: 30 mL via ORAL
  Filled 2019-04-28: qty 30

## 2019-04-28 MED ORDER — DIPHENHYDRAMINE HCL 25 MG PO CAPS: 25.0000 mg | ORAL_CAPSULE | ORAL | Status: DC | PRN

## 2019-04-28 MED ORDER — ACETAMINOPHEN 325 MG PO TABS
650.0000 mg | ORAL_TABLET | ORAL | Status: DC | PRN
  Administered 2019-04-29 – 2019-05-01 (×9): 650 mg via ORAL
  Filled 2019-04-28 (×9): qty 2

## 2019-04-28 MED ORDER — DIPHENHYDRAMINE HCL 50 MG/ML IJ SOLN: 12.5000 mg | INTRAMUSCULAR | Status: DC | PRN

## 2019-04-28 MED ORDER — NALBUPHINE HCL 10 MG/ML IJ SOLN: 5.0000 mg | INTRAMUSCULAR | Status: DC | PRN

## 2019-04-28 MED ORDER — FENTANYL CITRATE (PF) 100 MCG/2ML IJ SOLN
50.0000 ug | INTRAMUSCULAR | Status: DC | PRN
  Administered 2019-04-28: 50 ug via INTRAVENOUS
  Filled 2019-04-28: qty 2

## 2019-04-28 MED ORDER — SODIUM CHLORIDE 0.9 % IV SOLN
INTRAVENOUS | Status: AC
  Filled 2019-04-28: qty 500

## 2019-04-28 MED ORDER — SODIUM CHLORIDE 0.9 % IV SOLN
INTRAVENOUS | Status: DC | PRN
  Administered 2019-04-28: 14:00:00 via INTRAVENOUS

## 2019-04-28 MED ORDER — MEPERIDINE HCL 25 MG/ML IJ SOLN
INTRAMUSCULAR | Status: DC | PRN
  Administered 2019-04-28 (×2): 12.5 mg via INTRAVENOUS

## 2019-04-28 MED ORDER — PHENYLEPHRINE HCL-NACL 20-0.9 MG/250ML-% IV SOLN
INTRAVENOUS | Status: DC | PRN
  Administered 2019-04-28: 60 ug/min via INTRAVENOUS

## 2019-04-28 MED ORDER — SIMETHICONE 80 MG PO CHEW
80.0000 mg | CHEWABLE_TABLET | Freq: Three times a day (TID) | ORAL | Status: DC
  Administered 2019-04-29 – 2019-05-01 (×8): 80 mg via ORAL
  Filled 2019-04-28 (×7): qty 1

## 2019-04-28 MED ORDER — NALBUPHINE HCL 10 MG/ML IJ SOLN: 5.0000 mg | Freq: Once | INTRAMUSCULAR | Status: DC | PRN

## 2019-04-28 MED ORDER — SODIUM CHLORIDE 0.9 % IV SOLN
2.0000 g | Freq: Once | INTRAVENOUS | Status: AC
  Administered 2019-04-28: 2 g via INTRAVENOUS
  Filled 2019-04-28: qty 2000

## 2019-04-28 MED ORDER — SODIUM CHLORIDE (PF) 0.9 % IJ SOLN
INTRAMUSCULAR | Status: DC | PRN
  Administered 2019-04-28: 14 mL/h via EPIDURAL

## 2019-04-28 MED ORDER — TETANUS-DIPHTH-ACELL PERTUSSIS 5-2.5-18.5 LF-MCG/0.5 IM SUSP: 0.5000 mL | Freq: Once | INTRAMUSCULAR | Status: DC

## 2019-04-28 MED ORDER — SODIUM CHLORIDE 0.9 % IV SOLN
1.0000 g | INTRAVENOUS | Status: DC
  Filled 2019-04-28 (×3): qty 1000

## 2019-04-28 MED ORDER — MORPHINE SULFATE (PF) 0.5 MG/ML IJ SOLN
INTRAMUSCULAR | Status: DC | PRN
  Administered 2019-04-28: 3 mg via EPIDURAL

## 2019-04-28 MED ORDER — BUPIVACAINE HCL (PF) 0.5 % IJ SOLN
INTRAMUSCULAR | Status: AC
  Filled 2019-04-28: qty 30

## 2019-04-28 MED ORDER — EPHEDRINE 5 MG/ML INJ: 10.0000 mg | INTRAVENOUS | Status: DC | PRN

## 2019-04-28 MED ORDER — SIMETHICONE 80 MG PO CHEW
80.0000 mg | CHEWABLE_TABLET | ORAL | Status: DC
  Administered 2019-04-28 – 2019-04-30 (×2): 80 mg via ORAL
  Filled 2019-04-28 (×3): qty 1

## 2019-04-28 MED ORDER — OXYCODONE HCL 5 MG/5ML PO SOLN: 5.0000 mg | Freq: Once | ORAL | Status: DC | PRN

## 2019-04-28 MED ORDER — LACTATED RINGERS IV SOLN
INTRAVENOUS | Status: DC
  Administered 2019-04-28 – 2019-04-29 (×2): via INTRAVENOUS

## 2019-04-28 MED ORDER — NALOXONE HCL 0.4 MG/ML IJ SOLN: 0.4000 mg | INTRAMUSCULAR | Status: DC | PRN

## 2019-04-28 MED ORDER — HYDROMORPHONE HCL 1 MG/ML IJ SOLN: 0.2500 mg | INTRAMUSCULAR | Status: DC | PRN

## 2019-04-28 MED ORDER — PENICILLIN G 3 MILLION UNITS IVPB - SIMPLE MED
3.0000 10*6.[IU] | INTRAVENOUS | Status: DC
  Administered 2019-04-28: 3 10*6.[IU] via INTRAVENOUS
  Filled 2019-04-28 (×5): qty 100

## 2019-04-28 MED ORDER — MORPHINE SULFATE (PF) 0.5 MG/ML IJ SOLN
INTRAMUSCULAR | Status: AC
  Filled 2019-04-28: qty 10

## 2019-04-28 MED ORDER — ZOLPIDEM TARTRATE 5 MG PO TABS: 5.0000 mg | ORAL_TABLET | Freq: Every evening | ORAL | Status: DC | PRN

## 2019-04-28 MED ORDER — LACTATED RINGERS IV SOLN: INTRAVENOUS | Status: DC

## 2019-04-28 MED ORDER — LIDOCAINE HCL (PF) 1 % IJ SOLN
INTRAMUSCULAR | Status: DC | PRN
  Administered 2019-04-28: 11 mL via EPIDURAL

## 2019-04-28 MED ORDER — PHENYLEPHRINE 40 MCG/ML (10ML) SYRINGE FOR IV PUSH (FOR BLOOD PRESSURE SUPPORT)
80.0000 ug | PREFILLED_SYRINGE | INTRAVENOUS | Status: DC | PRN
  Administered 2019-04-28: 80 ug via INTRAVENOUS

## 2019-04-28 MED ORDER — FENTANYL-BUPIVACAINE-NACL 0.5-0.125-0.9 MG/250ML-% EP SOLN
12.0000 mL/h | EPIDURAL | Status: DC | PRN
  Filled 2019-04-28: qty 250

## 2019-04-28 MED ORDER — LIDOCAINE-EPINEPHRINE (PF) 2 %-1:200000 IJ SOLN
INTRAMUSCULAR | Status: DC | PRN
  Administered 2019-04-28: 2 mL via EPIDURAL
  Administered 2019-04-28: 3 mL via EPIDURAL
  Administered 2019-04-28: 5 mL via EPIDURAL
  Administered 2019-04-28: 2 mL via EPIDURAL

## 2019-04-28 MED ORDER — WITCH HAZEL-GLYCERIN EX PADS: 1.0000 "application " | MEDICATED_PAD | CUTANEOUS | Status: DC | PRN

## 2019-04-28 MED ORDER — LIDOCAINE-EPINEPHRINE (PF) 2 %-1:200000 IJ SOLN
INTRAMUSCULAR | Status: AC
  Filled 2019-04-28: qty 10

## 2019-04-28 MED ORDER — DEXTROSE 5 % IV SOLN
3.0000 g | INTRAVENOUS | Status: DC
  Filled 2019-04-28: qty 3000

## 2019-04-28 MED ORDER — ENOXAPARIN SODIUM 80 MG/0.8ML ~~LOC~~ SOLN
80.0000 mg | SUBCUTANEOUS | Status: DC
  Administered 2019-04-29 – 2019-05-01 (×3): 80 mg via SUBCUTANEOUS
  Filled 2019-04-28 (×3): qty 0.8

## 2019-04-28 MED ORDER — DIBUCAINE (PERIANAL) 1 % EX OINT: 1.0000 "application " | TOPICAL_OINTMENT | CUTANEOUS | Status: DC | PRN

## 2019-04-28 MED ORDER — ONDANSETRON HCL 4 MG/2ML IJ SOLN: 4.0000 mg | Freq: Three times a day (TID) | INTRAMUSCULAR | Status: DC | PRN

## 2019-04-28 MED ORDER — OXYTOCIN BOLUS FROM INFUSION: 500.0000 mL | Freq: Once | INTRAVENOUS | Status: DC

## 2019-04-28 MED ORDER — OXYCODONE HCL 5 MG PO TABS
5.0000 mg | ORAL_TABLET | ORAL | Status: DC | PRN
  Administered 2019-05-01 (×3): 5 mg via ORAL
  Filled 2019-04-28 (×3): qty 1

## 2019-04-28 MED ORDER — SODIUM CHLORIDE 0.9 % IV SOLN
INTRAVENOUS | Status: DC | PRN
  Administered 2019-04-28: 40 [IU] via INTRAVENOUS

## 2019-04-28 MED ORDER — SIMETHICONE 80 MG PO CHEW: 80.0000 mg | CHEWABLE_TABLET | ORAL | Status: DC | PRN

## 2019-04-28 MED ORDER — BUPIVACAINE HCL (PF) 0.5 % IJ SOLN
INTRAMUSCULAR | Status: DC | PRN
  Administered 2019-04-28: 30 mL

## 2019-04-28 MED ORDER — LACTATED RINGERS IV SOLN
INTRAVENOUS | Status: DC
  Administered 2019-04-28 (×4): via INTRAVENOUS

## 2019-04-28 MED ORDER — IBUPROFEN 800 MG PO TABS
800.0000 mg | ORAL_TABLET | Freq: Three times a day (TID) | ORAL | Status: AC
  Administered 2019-04-28 – 2019-05-01 (×8): 800 mg via ORAL
  Filled 2019-04-28 (×8): qty 1

## 2019-04-28 MED ORDER — LACTATED RINGERS IV SOLN: 500.0000 mL | INTRAVENOUS | Status: DC | PRN

## 2019-04-28 MED ORDER — KETOROLAC TROMETHAMINE 30 MG/ML IJ SOLN
30.0000 mg | Freq: Once | INTRAMUSCULAR | Status: AC | PRN
  Administered 2019-04-28: 30 mg via INTRAVENOUS

## 2019-04-28 MED ORDER — ENOXAPARIN SODIUM 40 MG/0.4ML ~~LOC~~ SOLN: 40.0000 mg | SUBCUTANEOUS | Status: DC

## 2019-04-28 MED ORDER — SENNOSIDES-DOCUSATE SODIUM 8.6-50 MG PO TABS
2.0000 | ORAL_TABLET | ORAL | Status: DC
  Administered 2019-04-28 – 2019-04-30 (×2): 2 via ORAL
  Filled 2019-04-28 (×3): qty 2

## 2019-04-28 MED ORDER — PHENYLEPHRINE 40 MCG/ML (10ML) SYRINGE FOR IV PUSH (FOR BLOOD PRESSURE SUPPORT)
PREFILLED_SYRINGE | INTRAVENOUS | Status: AC
  Filled 2019-04-28: qty 10

## 2019-04-28 MED ORDER — PROMETHAZINE HCL 25 MG/ML IJ SOLN: 6.2500 mg | INTRAMUSCULAR | Status: DC | PRN

## 2019-04-28 MED ORDER — MEPERIDINE HCL 25 MG/ML IJ SOLN: 6.2500 mg | INTRAMUSCULAR | Status: DC | PRN

## 2019-04-28 MED ORDER — ALBUMIN HUMAN 5 % IV SOLN
INTRAVENOUS | Status: DC | PRN
  Administered 2019-04-28: 14:00:00 via INTRAVENOUS

## 2019-04-28 MED ORDER — ACETAMINOPHEN 325 MG PO TABS: 650.0000 mg | ORAL_TABLET | ORAL | Status: DC | PRN

## 2019-04-28 MED ORDER — OXYTOCIN 40 UNITS IN NORMAL SALINE INFUSION - SIMPLE MED: 2.5000 [IU]/h | INTRAVENOUS | Status: AC

## 2019-04-28 MED ORDER — LACTATED RINGERS IV SOLN: 500.0000 mL | Freq: Once | INTRAVENOUS | Status: DC

## 2019-04-28 MED ORDER — PRENATAL MULTIVITAMIN CH
1.0000 | ORAL_TABLET | Freq: Every day | ORAL | Status: DC
  Administered 2019-04-29 – 2019-05-01 (×3): 1 via ORAL
  Filled 2019-04-28 (×3): qty 1

## 2019-04-28 MED ORDER — COCONUT OIL OIL
1.0000 "application " | TOPICAL_OIL | Status: DC | PRN
  Administered 2019-04-30: 1 via TOPICAL

## 2019-04-28 MED ORDER — SOD CITRATE-CITRIC ACID 500-334 MG/5ML PO SOLN: 30.0000 mL | ORAL | Status: DC

## 2019-04-28 MED ORDER — ONDANSETRON HCL 4 MG/2ML IJ SOLN
INTRAMUSCULAR | Status: AC
  Filled 2019-04-28: qty 2

## 2019-04-28 MED ORDER — PHENYLEPHRINE 40 MCG/ML (10ML) SYRINGE FOR IV PUSH (FOR BLOOD PRESSURE SUPPORT)
80.0000 ug | PREFILLED_SYRINGE | INTRAVENOUS | Status: DC | PRN
  Filled 2019-04-28: qty 10

## 2019-04-28 MED ORDER — MENTHOL 3 MG MT LOZG: 1.0000 | LOZENGE | OROMUCOSAL | Status: DC | PRN

## 2019-04-28 MED ORDER — PHENYLEPHRINE HCL (PRESSORS) 10 MG/ML IV SOLN
INTRAVENOUS | Status: DC | PRN
  Administered 2019-04-28 (×13): 80 ug via INTRAVENOUS

## 2019-04-28 MED ORDER — OXYTOCIN 40 UNITS IN NORMAL SALINE INFUSION - SIMPLE MED
2.5000 [IU]/h | INTRAVENOUS | Status: DC
  Filled 2019-04-28: qty 1000

## 2019-04-28 MED ORDER — SCOPOLAMINE 1 MG/3DAYS TD PT72
1.0000 | MEDICATED_PATCH | Freq: Once | TRANSDERMAL | Status: AC
  Administered 2019-04-28: 1.5 mg via TRANSDERMAL
  Filled 2019-04-28: qty 1

## 2019-04-28 MED ORDER — NALOXONE HCL 4 MG/10ML IJ SOLN
1.0000 ug/kg/h | INTRAVENOUS | Status: DC | PRN
  Filled 2019-04-28: qty 5

## 2019-04-28 SURGICAL SUPPLY — 45 items
BENZOIN TINCTURE PRP APPL 2/3 (GAUZE/BANDAGES/DRESSINGS) IMPLANT
CHLORAPREP W/TINT 26ML (MISCELLANEOUS) ×3 IMPLANT
CLAMP CORD UMBIL (MISCELLANEOUS) IMPLANT
CLOSURE WOUND 1/2 X4 (GAUZE/BANDAGES/DRESSINGS)
CLOTH BEACON ORANGE TIMEOUT ST (SAFETY) ×3 IMPLANT
DRSG OPSITE POSTOP 4X10 (GAUZE/BANDAGES/DRESSINGS) ×3 IMPLANT
ELECT REM PT RETURN 9FT ADLT (ELECTROSURGICAL) ×3
ELECTRODE REM PT RTRN 9FT ADLT (ELECTROSURGICAL) ×1 IMPLANT
EXTRACTOR VACUUM BELL STYLE (SUCTIONS) IMPLANT
GAUZE SPONGE 4X4 12PLY STRL (GAUZE/BANDAGES/DRESSINGS) ×3 IMPLANT
GLOVE BIOGEL PI IND STRL 6.5 (GLOVE) ×1 IMPLANT
GLOVE BIOGEL PI IND STRL 7.0 (GLOVE) ×2 IMPLANT
GLOVE BIOGEL PI INDICATOR 6.5 (GLOVE) ×2
GLOVE BIOGEL PI INDICATOR 7.0 (GLOVE) ×4
GLOVE ORTHOPEDIC STR SZ6.5 (GLOVE) ×3 IMPLANT
GOWN STRL REUS W/TWL LRG LVL3 (GOWN DISPOSABLE) ×9 IMPLANT
HEMOSTAT ARISTA ABSORB 3G PWDR (HEMOSTASIS) ×3 IMPLANT
HEMOSTAT SURGICEL 2X14 (HEMOSTASIS) ×3 IMPLANT
HOVERMATT SINGLE USE (MISCELLANEOUS) ×3 IMPLANT
KIT ABG SYR 3ML LUER SLIP (SYRINGE) IMPLANT
NEEDLE HYPO 22GX1.5 SAFETY (NEEDLE) ×3 IMPLANT
NEEDLE HYPO 25X1 1.5 SAFETY (NEEDLE) IMPLANT
NS IRRIG 1000ML POUR BTL (IV SOLUTION) ×3 IMPLANT
PACK C SECTION WH (CUSTOM PROCEDURE TRAY) ×3 IMPLANT
PAD ABD 8X10 STRL (GAUZE/BANDAGES/DRESSINGS) ×3 IMPLANT
PAD OB MATERNITY 4.3X12.25 (PERSONAL CARE ITEMS) ×3 IMPLANT
PENCIL SMOKE EVAC W/HOLSTER (ELECTROSURGICAL) ×3 IMPLANT
RETRACTOR TRAXI PANNICULUS (MISCELLANEOUS) ×1 IMPLANT
STRIP CLOSURE SKIN 1/2X4 (GAUZE/BANDAGES/DRESSINGS) IMPLANT
SUT MON AB 4-0 PS1 27 (SUTURE) ×3 IMPLANT
SUT PDS AB 0 CTX 60 (SUTURE) ×3 IMPLANT
SUT PLAIN 2 0 (SUTURE) ×2
SUT PLAIN ABS 2-0 CT1 27XMFL (SUTURE) ×1 IMPLANT
SUT PROLENE 1 CT (SUTURE) IMPLANT
SUT VIC AB 0 CT1 36 (SUTURE) ×12 IMPLANT
SUT VIC AB 0 CTX 36 (SUTURE) ×2
SUT VIC AB 0 CTX36XBRD ANBCTRL (SUTURE) ×1 IMPLANT
SUT VIC AB 2-0 CT1 (SUTURE) ×6 IMPLANT
SUT VIC AB 2-0 CT1 27 (SUTURE) ×2
SUT VIC AB 2-0 CT1 TAPERPNT 27 (SUTURE) ×1 IMPLANT
SYR CONTROL 10ML LL (SYRINGE) ×3 IMPLANT
TOWEL OR 17X24 6PK STRL BLUE (TOWEL DISPOSABLE) ×3 IMPLANT
TRAXI PANNICULUS RETRACTOR (MISCELLANEOUS) ×2
TRAY FOLEY W/BAG SLVR 14FR LF (SET/KITS/TRAYS/PACK) ×3 IMPLANT
WATER STERILE IRR 1000ML POUR (IV SOLUTION) ×3 IMPLANT

## 2019-04-28 NOTE — Discharge Summary (Addendum)
Postpartum Discharge Summary     Patient Name: Paula Sandoval DOB: [birth year] MRN: [id number]  Date of admission: 04/28/2019 Delivering Provider: Sloan Leiter   Date of discharge: 05/01/2019  Admitting diagnosis: 41wks, contractions Intrauterine pregnancy: [redacted]w[redacted]d     Secondary diagnosis:  Principal Problem:   Indication for care in labor or delivery Active Problems:   Morbid obesity with BMI of 45.0-49.9, adult ([place])   Previous cesarean delivery, antepartum condition or complication   Group B streptococcal carriage complicating pregnancy   History of VBAC   Unwanted fertility  Additional problems: non-reassuring fetal status     Discharge diagnosis: Term Pregnancy Delivered                                                                                                Post partum procedures:bilateral tubal ligation  Augmentation: Pitocin  Complications: None  Hospital course:  Onset of Labor With Unplanned C/S  28 y.o. yo G3P3003 at [redacted]w[redacted]d was admitted in Latent Labor on 04/28/2019. Patient had a labor course significant for progressing to 7 cm, then having repetitive prolonged decels. Membrane Rupture Time/Date: 5:30 AM ,04/28/2019   The patient went for cesarean section due to Non-Reassuring FHR, and delivered a Viable infant,04/28/2019. She also underwent bilateral tubal ligation.   Details of operation can be found in separate operative note. Patient had an uncomplicated postpartum course.  She is ambulating,tolerating a regular diet, passing flatus, and urinating well.  Patient is discharged home in stable condition 05/01/19.  Magnesium Sulfate recieved: No BMZ received: No  Physical exam  Vitals:   04/30/19 0528 04/30/19 1409 04/30/19 2140 05/01/19 0507  BP: 123/71 114/80 105/65 126/90  Pulse: (!) 105 (!) 103 (!) 102 99  Resp: 18 18 16 18   Temp: 98.3 F (36.8 C) 98.2 F (36.8 C) 99.3 F (37.4 C) 98.1 F (36.7 C)  TempSrc: Oral Oral Oral Oral  SpO2: 99% 100%   100%  Weight:      Height:       General: alert, cooperative and no distress Lochia: appropriate Uterine Fundus: firm Incision: Healing well with no significant drainage, Dressing is clean, dry, and intact DVT Evaluation: No evidence of DVT seen on physical exam. Negative Homan's sign. Labs: Lab Results  Component Value Date   WBC 17.9 (H) 04/29/2019   HGB 7.5 (L) 04/29/2019   HCT 25.3 (L) 04/29/2019   MCV 79.1 (L) 04/29/2019   PLT 302 04/29/2019   CMP Latest Ref Rng & Units 04/28/2019  Glucose 70 - 99 mg/dL -  BUN 6 - 20 mg/dL -  Creatinine 0.44 - 1.00 mg/dL 0.82  Sodium 135 - 145 mmol/L -  Potassium 3.5 - 5.1 mmol/L -  Chloride 98 - 111 mmol/L -  CO2 22 - 32 mmol/L -  Calcium 8.9 - 10.3 mg/dL -  Total Protein 6.5 - 8.1 g/dL -  Total Bilirubin 0.3 - 1.2 mg/dL -  Alkaline Phos 38 - 126 U/L -  AST 15 - 41 U/L -  ALT 0 - 44 U/L -    Discharge instruction: per After Visit  Summary and "Baby and Me Booklet".  After visit meds:  Allergies as of 05/01/2019   No Known Allergies     Medication List    TAKE these medications   ibuprofen 800 MG tablet Commonly known as: ADVIL Take 1 tablet (800 mg total) by mouth every 8 (eight) hours.   oxyCODONE-acetaminophen 5-325 MG tablet Commonly known as: Percocet Take 1 tablet by mouth every 4 (four) hours as needed for severe pain.   prenatal multivitamin Tabs tablet Take 1 tablet by mouth daily at 12 noon.       Diet: routine diet  Activity: Advance as tolerated. Pelvic rest for 6 weeks.   Outpatient follow up:2 weeks Follow up Appt: Future Appointments  Date Time Provider [place]  05/16/2019 10:30 AM Sloan Leiter, MD [place] None  05/29/2019 11:00 AM Lajean Manes, CNM [facility] None   Follow up Visit: Follow-up [place]. Go in 2 week(s).   Specialty: Obstetrics and Gynecology Contact information: [address], [place]  [place] [phone]           Please schedule this patient for Postpartum visit in: 4 weeks with the following provider: MD For C/S patients schedule nurse incision check in weeks 2 weeks: yes High risk pregnancy complicated by: obesity Delivery mode:  CS Anticipated Birth Control:  BTL done PP PP Procedures needed: Incision check  Schedule Integrated BH visit: no  Newborn Data: Live born female  Birth Weight: 9 lb 13.5 oz (4465 g) APGAR: 7, 8  Newborn Delivery   Birth date/time: 04/28/2019 13:41:00 Delivery type: C-Section, Low Transverse Trial of labor: Yes C-section categorization: Repeat      Baby Feeding: Bottle Disposition:home with mother

## 2019-04-28 NOTE — Progress Notes (Addendum)
Subjective: Paula Sandoval is a 28 y.o. G3P2002 at [redacted]w[redacted]d by ultrasound admitted for rupture of membranes. Called to the room for FHR concerns (prolonged decel down to 60 bpm). RN actively changing positions.  Objective: BP (!) 108/44   Pulse (!) 105   Temp 98.1 F (36.7 C) (Axillary)   Resp 18   Ht 5\' 9"  (1.753 m)   Wt (!) 161 kg   LMP 06/10/2018 (Exact Date)   SpO2 96%   BMI 52.42 kg/m    FHT:  FHR: 120 bpm, variability: moderate,  accelerations:  Abscent,  decelerations:  Present prolonged down to 60s x 7 mins UC:   regular, every 2-3 minutes SVE:   Dilation: 7.5 Effacement (%): 90 with thickened anterior portion Station: -2 Exam by:: no change RRenato Battles, CNM Failed attempt at placement of fetal scalp electrode by CNM  (+) scalp stim at the time of placement  Jenn Cox, RN was successful in placement of FSE  FHR was then in the 120s  Labs: Lab Results  Component Value Date   WBC 12.9 (H) 04/28/2019   HGB 11.3 (L) 04/28/2019   HCT 37.8 04/28/2019   MCV 77.9 (L) 04/28/2019   PLT 385 04/28/2019    Assessment / Plan: Protracted active phase Prolonged Fetal Heart rate decel  Labor: Protracted active phase of labor Preeclampsia:  n/a Fetal Wellbeing:  Category II Pain Control:  Epidural I/D:  n/a Anticipated MOD:  Cautious for C/S Dr. Rosana Hoes in room to evaluate pt and discuss R/B of RCS and BTL  Laury Deep, MSN, CNM 04/28/2019, 12:35 PM

## 2019-04-28 NOTE — Progress Notes (Signed)
Pt has borderline low BP's. Pt talking, occasionally breathing through pressure in rectum. Denies symptoms of dizziness, weakness, ringing in ears, nausea or feeling of fainting.

## 2019-04-28 NOTE — MAU Note (Signed)
Contractions since 1:30 am. ? Leaking clear at 0530. . Baby moving well. No bleeding.

## 2019-04-28 NOTE — Transfer of Care (Signed)
Immediate Anesthesia Transfer of Care Note  Patient: Paula Sandoval  Procedure(s) Performed: CESAREAN SECTION (N/A )  Patient Location: PACU  Anesthesia Type:Epidural  Level of Consciousness: awake and alert   Airway & Oxygen Therapy: Patient Spontanous Breathing  Post-op Assessment: Report given to RN  Post vital signs: Reviewed  Last Vitals:  Vitals Value Taken Time  BP 102/63 04/28/19 1505  Temp    Pulse    Resp 14 04/28/19 1508  SpO2    Vitals shown include unvalidated device data.  Last Pain:  Vitals:   04/28/19 1234  TempSrc:   PainSc: 0-No pain      Patients Stated Pain Goal: 0 (98/24/29 9806)  Complications: No apparent anesthesia complications

## 2019-04-28 NOTE — H&P (Addendum)
Obstetric Admission History & Physical  Subjective Paula Sandoval is a 28 y.o. female G3P2002 with IUP at [redacted]w[redacted]d by [place] at 12 wks admitted for SROM.  Reports fetal movement. Denies vaginal bleeding. She received her prenatal care at [facility].  Support person in labor: Theatre stage manager Provider  Office Location [facility] Dating   [place]  Language   English Anatomy US   Normal  Flu Vaccine   declined 10/22/18 Genetic Screen  Declined    TDaP vaccine  Declined Hgb A1C or  GTT Third trimester Normal  Rhogam     LAB RESULTS   Feeding Plan  Breast Blood Type O/Positive/-- (12/23 1540)   Contraception  BTL Antibody Negative (12/23 1540)  Circumcision  Yes Rubella 11.60 (12/23 1540)  Pediatrician   [facility] RPR Non Reactive (02/05 1014)   Support Person  Autumn Patty - FOB HBsAg Negative (12/23 1540)   Prenatal Classes  No HIV Non Reactive (02/05 1014)  VBAC Consent  GBS Positive (06/03 1125)(For PCN allergy, check sensitivities)     Pap Normal    Hgb Electro  AA    CF Neg    SMA 2   Ultrasounds . 12+6: Dating ultrasound . 18+6: Anatomy, suboptimal views . 23+1: Suboptimal views . 35+ 6 suboptimal views  . 36.6, EFW 3865 g,> 90%, 8 pounds 8 ounces, anatomy otherwise complete except for poorly visualized lips, spine, LVOT  Prenatal History/Complications: . Obesity . Status post VBAC . TOLAC . Asthma  Past Medical History:  Diagnosis Date  . Asthma   . 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 CESAREAN SECTION  02/22/2012   Procedure: CESAREAN SECTION;  Surgeon: Frederico Hamman, MD;  Location: [facility];  Service: Gynecology;  Laterality: N/A;  . excision of tumor right leg  2009   OB History    Gravida  3   Para  2   Term  2   Preterm      AB      Living  2     SAB      TAB      Ectopic      Multiple  0   Live Births  2          Social History   Socioeconomic History  . Marital status:  Single    Spouse name: Not on file  . Number of children: 2  . Years of education: 84  . Highest education level: Some college, no degree  Occupational History  . Occupation: Best boy: [employer]  . Financial resource strain: Not hard at all  . Food insecurity    Worry: Never true    Inability: Never true  . Transportation needs    Medical: No    Non-medical: No  Tobacco Use  . Smoking status: Former Smoker    Types: Cigarettes    Quit date: 2008    Years since quitting: 12.4  . Smokeless tobacco: Never Used  Substance and Sexual Activity  . Alcohol use: No  . Drug use: No  . Sexual activity: Yes    Birth control/protection: None    Comment: occasional condom use  Lifestyle  . Physical activity    Days per week: 5 days    Minutes per session: 150+ min  . Stress: Only a little  Relationships  . Social connections  Talks on phone: More than three times a week    Gets together: Twice a week    Attends religious service: 1 to 4 times per year    Active member of club or organization: No    Attends meetings of clubs or organizations: Never    Relationship status: Living with partner  Other Topics Concern  . Not on file  Social History Narrative  . Not on file   Family History  Problem Relation Age of Onset  . Cancer Maternal Grandmother   . Anesthesia problems Neg Hx   . Hypotension Neg Hx   . Malignant hyperthermia Neg Hx   . Pseudochol deficiency Neg Hx    Allergies: No Known Allergies Medications:  Current Meds  Medication Sig  . Prenatal Vit-Fe Fumarate-FA (PRENATAL MULTIVITAMIN) TABS tablet Take 1 tablet by mouth daily at 12 noon.    Review of Systems  All systems reviewed and negative except as stated in HPI  Objective Physical Exam:  LMP 06/10/2018 (Exact Date)  General appearance: alert, cooperative, appears stated age and moderate distress with contractions Lungs: no respiratory distress Heart: RRR. No BLEE.  Abdomen:  soft, non-tender; gravid  Pelvic: deferred Extremities: Moving spontaneously, warm, well perfused. 2+ DP.  Uterine activity: q3-5 minutes Cervical Exam:  Dilation: 4 Effacement (%): 90 Station: -2 Exam by:: Alycia Rossetti RN Presentation: cephalic Fetal monitoring: baseline 145 / mod variability/ +a / -d  Prenatal labs: ABO, Rh: O/Positive/-- (12/23 1540) Antibody: Negative (12/23 1540) Rubella: 11.60 (12/23 1540) RPR: Non Reactive (02/05 1014)  HBsAg: Negative (12/23 1540)  HIV: Non Reactive (02/05 1014)  GBS: Positive (06/03 1125)  Glucola: normal Genetic screening:  declined  Prenatal Transfer Tool  Maternal Diabetes: No Genetic Screening: Declined Maternal Ultrasounds/Referrals: Normal and Other: anatomy not completed 2/2 maternal habitus and poor positioning Fetal Ultrasounds or other Referrals:  None Maternal Substance Abuse:  No Significant Maternal Medications:  None Significant Maternal Lab Results: Group B Strep positive  Results for orders placed or performed during the hospital encounter of 04/28/19 (from the past 24 hour(s))  POCT fern test   Collection Time: 04/28/19  6:55 AM  Result Value Ref Range   POCT Fern Test Positive = ruptured amniotic membanes     Patient Active Problem List   Diagnosis Date Noted  . History of VBAC 04/10/2019  . Group B streptococcal carriage complicating pregnancy 26/71/2458  . Encounter for supervision of normal pregnancy, antepartum 10/22/2018  . Previous cesarean delivery, antepartum condition or complication 09/98/3382  . Morbid obesity with BMI of 45.0-49.9, adult ([place]) 05/26/2016    Assessment & Plan:  Paula Sandoval is a 28 y.o. G3P2002 at [redacted]w[redacted]d - SROM 4/90/-2  Labor: Expectant management  . Pain control: Epidural . Anticipated MOD: NSVD . PPH risk: high  Fetal Wellbeing: 36.6, EFW 3865 g,> 90%. Cephalic by CE per RN.  Category I tracing. . GBS: Positive (06/03 1125) > Ampicillin  . Continuous fetal  monitoring  Postpartum Planning . Circ(yes, in)/both/ppBTL  . Rubella immune, Tdap declined during [facility]   Zettie Cooley, M.D.  Family Medicine  PGY-1 04/28/2019 6:59 AM   I confirm that I have verified the information documented in the resident's note and that I have also personally reperformed the history, physical exam and all medical decision making activities of this service and have verified that all service and findings are accurately documented in this student's note.   Patient found to now be 7 cm. I discussed POC with  her and await a TOLAC delivery.  Laury Deep, CNM 04/28/2019 10:11 AM

## 2019-04-28 NOTE — Progress Notes (Signed)
This note also relates to the following rows which could not be included: Pulse Rate - Cannot attach notes to unvalidated device data ECG Heart Rate - Cannot attach notes to unvalidated device data Resp - Cannot attach notes to unvalidated device data SpO2 - Cannot attach notes to unvalidated device data

## 2019-04-28 NOTE — Anesthesia Procedure Notes (Signed)
Epidural Patient location during procedure: OB Start time: 04/28/2019 8:28 AM End time: 04/28/2019 8:40 AM  Staffing Anesthesiologist: Lynda Rainwater, MD Performed: anesthesiologist   Preanesthetic Checklist Completed: patient identified, site marked, surgical consent, pre-op evaluation, timeout performed, IV checked, risks and benefits discussed and monitors and equipment checked  Epidural Patient position: sitting Prep: ChloraPrep Patient monitoring: heart rate, cardiac monitor, continuous pulse ox and blood pressure Approach: midline Location: L2-L3 Injection technique: LOR saline  Needle:  Needle type: Tuohy  Needle gauge: 17 G Needle length: 9 cm Needle insertion depth: 9 cm Catheter type: closed end flexible Catheter size: 20 Guage Catheter at skin depth: 14 cm Test dose: negative  Assessment Events: blood not aspirated, injection not painful, no injection resistance, negative IV test and no paresthesia  Additional Notes Reason for block:procedure for pain

## 2019-04-28 NOTE — Progress Notes (Signed)
Faculty Note  S: In to see patient for prolonged decels. She is comfortable with epidural but concerned she can feel.   O: BP (!) 108/44   Pulse (!) 105   Temp 98.1 F (36.7 C) (Axillary)   Resp 18   Ht 5\' 9"  (1.753 m)   Wt (!) 161 kg   LMP 06/10/2018 (Exact Date)   SpO2 96%   BMI 52.42 kg/m   Gen: alert, oriented SVE: 6/50/02  FHT: 140 bpm, moderate variability, accels present, several prolonged decels Toco: ctx every few min   A/P: Pt is 28 y.o. [id number] @ [redacted]w[redacted]d who is admitted for PROM with augmentation of labor. She is for [facility]. She has had several prolonged decels and has been 7 cm for several hours. FHT recovered with repositioning. Reviewed course with patient, that she has not progressed for several hours and she is having prolonged decels. Reviewed that baby may/may not continue to tolerate labor. Recommended we proceed with repeat c-section as she is not progressing rapidly and fetus not tolerating, she is agreeable. Desires to have bilateral tubal ligation done as well.   The risks of cesarean section were discussed with the patient; including but not limited to: infection which may require antibiotics; bleeding which may require transfusion or re-operation; injury to bowel, bladder, ureters or other surrounding organs; need for additional procedures in the event of a life-threatening hemorrhage; placental abnormalities wth subsequent pregnancies, risk of needing c-sections in future pregnancies, incisional problems, thromboembolic phenomenon and other postoperative/anesthesia complications. Reviewed risks of bilateral tubal ligation including infection, hemorrhage, damage to surrounding tissue and organs, risk of regret. Reviewed bilateral tubal ligation failure rate of ~1%, and that there is a slightly higher risk of ectopic after tubal ligation; if she has any reason to believe she is pregnant, she should take a pregnancy test. She understands this is an elective procedure and  again affirms her desire. Answered all questions. The patient verbalized understanding of the plan, giving informed consent for the procedure. She is agreeable to blood transfusion in the event of emergency.  Patient has been NPO, she will remain NPO for procedure Anesthesia and OR aware Preoperative prophylactic antibiotics and SCDs ordered on call to the OR  To OR when ready   K. Arvilla Meres, M.D. Attending Center for [employer] farm manager)

## 2019-04-28 NOTE — Op Note (Signed)
Paula Sandoval PROCEDURE DATE: 04/28/2019  PREOPERATIVE DIAGNOSES: Intrauterine pregnancy at [redacted]w[redacted]d weeks gestation; non-reassuring fetal status and prior uterine incision, unwanted fertility  POSTOPERATIVE DIAGNOSES: The same  PROCEDURE: Repeat Low Transverse Cesarean Section, bilateral tubal ligation  SURGEON:  Feliz Beam, MD  ASSISTANT:  Laury Deep, CNM  ANESTHESIOLOGY TEAM: Anesthesiologist: Lynda Rainwater, MD CRNA: Garner Nash, CRNA  INDICATIONS: Paula Sandoval is a 28 y.o. [phone] at [redacted]w[redacted]d here for cesarean section secondary to the indications listed under preoperative diagnoses; please see preoperative note for further details.  The risks of cesarean section were discussed with the patient including but were not limited to: bleeding which may require transfusion or reoperation; infection which may require antibiotics; injury to bowel, bladder, ureters or other surrounding organs; injury to the fetus; need for additional procedures including hysterectomy in the event of a life-threatening hemorrhage; placental abnormalities wth subsequent pregnancies, incisional problems, thromboembolic phenomenon and other postoperative/anesthesia complications.  She consents to blood transfusion in the event of an emergency. The patient verbalized understanding of the plan, giving informed written consent for the procedure.    FINDINGS:  Viable female infant in LOP, somewhat asynclinitic position presentation.  Apgars 7 and 9.  Clear amniotic fluid.  Intact placenta, three vessel cord.  Normal uterus, fallopian tubes and ovaries bilaterally.  ANESTHESIA: Epidural INTRAVENOUS FLUIDS: 3000 ml   ESTIMATED BLOOD LOSS: 1027 ml URINE OUTPUT:  150 ml SPECIMENS: Placenta and fallopian tubes sent to pathology COMPLICATIONS: None immediate  PROCEDURE IN DETAIL:  The patient preoperatively received intravenous antibiotics and had sequential compression devices applied to her lower extremities.   She was then taken to the operating room where the epidural anesthesia was dosed up to surgical level and was found to be adequate. She was then placed in a dorsal supine position with a leftward tilt, and a Traxi retraction device was placed to good effect on the pannus/abdomen. She was prepped and draped in a sterile manner.  A foley catheter was placed into her bladder with sterile technique and attached to constant gravity.  After a timeout was performed, a Pfannenstiel skin incision was made with scalpel over her preexisting scar and carried through to the underlying layer of fascia. The fascia was incised in the midline, and this incision was extended bilaterally using the Mayo scissors.  Kocher clamps were applied to the superior aspect of the fascial incision and the underlying rectus muscles were dissected off sharply.  A similar process was carried out on the inferior aspect of the fascial incision. The rectus muscles were separated in the midline bluntly and the peritoneum was entered bluntly. The peritoneal incision was carefully extended sharply laterally and caudad with good visualization of the bladder. The uterus appeared normal. The Alexis O-ring retractor was placed into the incision, taking care not to incorporate bowel or omentum. The bladder blade was inserted. Attention was turned to the lower uterine segment where a low transverse hysterotomy was made with a scalpel and extended bilaterally bluntly.  The infant was delivered from LOP position, nose and mouth were bulb suctioned, and the cord clamped and cut after 1 minute. The infant was then handed over to the waiting neonatology team. Uterine massage was then performed, and the placenta delivered intact with a three-vessel cord. The uterus was then cleared of clots and debris. A vertical extension down towards the cervix was noted and had significant bleeding. This was repaired with 2-0 Vicryl in several layers to good effect.  The  hysterotomy was closed with 0 Vicryl in a running locked fashion, and an second layer was also placed with 0 Vicryl. Figure-of-eight 0 Vicryl serosal stitches were placed to help with hemostasis as tone was good but uterus was overall very oozy.  The fallopian tubes and ovaries were visualized bilaterally and normal appearing.   Attention was then turned to the fallopian tubes, and the right fallopian tube was clamped using a babcock and followed out to its fimbriated end. A 3 cm portion of the mid-portion of the fallopian tube was grasped with babcock graspers and elevated. The elevated loop of tube was tied off with 2-0 plain gut. A second tie was placed under the first tie and a 2 cm loop of tube was excised by bluntly creating a hole in the mesosalpinx under the tied off portion above the suture and excising each end of tube with the metzenbaum scissors via a modified pomeroy fashion.  The remaining pedicles of the tube were inspected and found to be hemostatic. The left fallopian tube was visualized and followed out to the fimbriated end.  A similar process was carried out for the left fallopian tube and hemostasis noted at the pedicles. Good hemostasis was noted overall.   Attention turned back to the hysterotomy which was again oozing. A figure of 8 placed with 0 Vicryl showed good hemostatic effect and Arista was placed over the hysterotomy but oozing continued. The oozing ceased with pressure and surgical was then placed over the hysterotomy to good effect. CBC and DIC panel ordered. The Alexis retractor was removed.  The pelvis was cleared of all clot and debris. Hemostasis was again confirmed on all surfaces. The fascia was then closed using 0 PDS in a running fashion.  The subcutaneous layer was irrigated, then reapproximated with 2-0 plain gut, and 30 ml of 0.5% Marcaine was injected subcutaneously around the incision.  The skin was closed with a 4-0 Monocryl subcuticular stitch. The patient  tolerated the procedure well. Sponge, lap, instrument and needle counts were correct x 3.  She was taken to the recovery room in stable condition.    Feliz Beam, M.D. Attending [place], [facility] for [employer], [place]

## 2019-04-28 NOTE — Anesthesia Preprocedure Evaluation (Signed)
Anesthesia Evaluation  Patient identified by MRN, date of birth, ID band Patient awake    Reviewed: Allergy & Precautions, H&P , Patient's Chart, lab work & pertinent test results  Airway Mallampati: II  TM Distance: >3 FB Neck ROM: full    Dental no notable dental hx.    Pulmonary former smoker,    Pulmonary exam normal breath sounds clear to auscultation       Cardiovascular negative cardio ROS Normal cardiovascular exam     Neuro/Psych negative neurological ROS  negative psych ROS   GI/Hepatic negative GI ROS, Neg liver ROS,   Endo/Other  Morbid obesity  Renal/GU negative Renal ROS     Musculoskeletal   Abdominal (+) + obese,   Peds  Hematology negative hematology ROS (+)   Anesthesia Other Findings   Reproductive/Obstetrics (+) Pregnancy                             Anesthesia Physical  Anesthesia Plan  ASA: III  Anesthesia Plan: Epidural   Post-op Pain Management:    Induction:   PONV Risk Score and Plan:   Airway Management Planned:   Additional Equipment:   Intra-op Plan:   Post-operative Plan:   Informed Consent: I have reviewed the patients History and Physical, chart, labs and discussed the procedure including the risks, benefits and alternatives for the proposed anesthesia with the patient or authorized representative who has indicated his/her understanding and acceptance.       Plan Discussed with:   Anesthesia Plan Comments:         Anesthesia Quick Evaluation

## 2019-04-28 NOTE — Anesthesia Postprocedure Evaluation (Signed)
Anesthesia Post Note  Patient: Paula Sandoval  Procedure(s) Performed: CESAREAN SECTION (N/A )     Patient location during evaluation: Mother Baby Anesthesia Type: Epidural Level of consciousness: awake and alert Pain management: pain level controlled Vital Signs Assessment: post-procedure vital signs reviewed and stable Respiratory status: spontaneous breathing, nonlabored ventilation and respiratory function stable Cardiovascular status: stable Postop Assessment: no headache, no backache and epidural receding Anesthetic complications: no    Last Vitals:  Vitals:   04/28/19 1631 04/28/19 1632  BP:    Pulse: 91   Resp: 15 15  Temp:    SpO2: 100%     Last Pain:  Vitals:   04/28/19 1615  TempSrc:   PainSc: 4    Pain Goal: Patients Stated Pain Goal: 0 (04/28/19 0645)  LLE Motor Response: Purposeful movement (04/28/19 1621)   RLE Motor Response: Purposeful movement (04/28/19 1621)       Epidural/Spinal Function Cutaneous sensation: Able to Discern Pressure (04/28/19 1621), Patient able to flex knees: Yes (04/28/19 1621), Patient able to lift hips off bed: No (04/28/19 1621), Back pain beyond tenderness at insertion site: No (04/28/19 1621), Progressively worsening motor and/or sensory loss: No (04/28/19 1621), Bowel and/or bladder incontinence post epidural: No (04/28/19 1621)  Lynda Rainwater

## 2019-04-29 ENCOUNTER — Encounter (HOSPITAL_COMMUNITY): Payer: Self-pay | Admitting: Obstetrics and Gynecology

## 2019-04-29 ENCOUNTER — Encounter: Payer: BC Managed Care – PPO | Admitting: Obstetrics & Gynecology

## 2019-04-29 LAB — CBC WITH DIFFERENTIAL/PLATELET
Abs Immature Granulocytes: 0.11 10*3/uL — ABNORMAL HIGH (ref 0.00–0.07)
Basophils Absolute: 0 10*3/uL (ref 0.0–0.1)
Basophils Relative: 0 %
Eosinophils Absolute: 0 10*3/uL (ref 0.0–0.5)
Eosinophils Relative: 0 %
HCT: 25.3 % — ABNORMAL LOW (ref 36.0–46.0)
Hemoglobin: 7.5 g/dL — ABNORMAL LOW (ref 12.0–15.0)
Immature Granulocytes: 1 %
Lymphocytes Relative: 11 %
Lymphs Abs: 2 10*3/uL (ref 0.7–4.0)
MCH: 23.4 pg — ABNORMAL LOW (ref 26.0–34.0)
MCHC: 29.6 g/dL — ABNORMAL LOW (ref 30.0–36.0)
MCV: 79.1 fL — ABNORMAL LOW (ref 80.0–100.0)
Monocytes Absolute: 1.6 10*3/uL — ABNORMAL HIGH (ref 0.1–1.0)
Monocytes Relative: 9 %
Neutro Abs: 14.1 10*3/uL — ABNORMAL HIGH (ref 1.7–7.7)
Neutrophils Relative %: 79 %
Platelets: 302 10*3/uL (ref 150–400)
RBC: 3.2 MIL/uL — ABNORMAL LOW (ref 3.87–5.11)
RDW: 17 % — ABNORMAL HIGH (ref 11.5–15.5)
WBC: 17.9 10*3/uL — ABNORMAL HIGH (ref 4.0–10.5)
nRBC: 0 % (ref 0.0–0.2)

## 2019-04-29 LAB — CBC
HCT: 24.3 % — ABNORMAL LOW (ref 36.0–46.0)
Hemoglobin: 7.3 g/dL — ABNORMAL LOW (ref 12.0–15.0)
MCH: 23.5 pg — ABNORMAL LOW (ref 26.0–34.0)
MCHC: 30 g/dL (ref 30.0–36.0)
MCV: 78.1 fL — ABNORMAL LOW (ref 80.0–100.0)
Platelets: 281 10*3/uL (ref 150–400)
RBC: 3.11 MIL/uL — ABNORMAL LOW (ref 3.87–5.11)
RDW: 17.1 % — ABNORMAL HIGH (ref 11.5–15.5)
WBC: 19.2 10*3/uL — ABNORMAL HIGH (ref 4.0–10.5)
nRBC: 0 % (ref 0.0–0.2)

## 2019-04-29 LAB — ABO/RH: ABO/RH(D): O POS

## 2019-04-29 LAB — RPR: RPR Ser Ql: NONREACTIVE

## 2019-04-29 NOTE — Progress Notes (Signed)
Post Op Day 1  Subjective Had an episode of vomiting and lightheadedness yesterday at 6:30pm. Reports dizziness all day prior to that episode. Has not gotten up yet this morning. Voiding, tolerating PO. Pain well controlled.  Objective BP 114/71 (BP Location: Left Arm)   Pulse 97   Temp 98.1 F (36.7 C) (Oral)   Resp 18   Ht 5\' 9"  (1.753 m)   Wt (!) 161 kg   LMP 06/10/2018 (Exact Date)   SpO2 99%   Breastfeeding Unknown   BMI 52.42 kg/m  Intake/Output      06/28 0701 - 06/29 0700 06/29 0701 - 06/30 0700   I.V. (mL/kg) 4000 (24.8)    Other 0    IV Piggyback 250    Total Intake(mL/kg) 4250 (26.4)    Urine (mL/kg/hr) 1275 (0.3)    Emesis/NG output 0    Blood 1027    Total Output 2302    Net +1948         Emesis Occurrence 1 x      Physical Exam:  General: alert, cooperative, NAD Lochia: appropriate Uterine Fundus: firm Incision: no significant drainage, pressure dressing in place DVT Evaluation: No evidence of DVT, extremities warm and well perfused.   Recent Labs    04/28/19 0715 04/28/19 1515 04/29/19 0437  HGB 11.3* 9.3* 7.3*  HCT 37.8 30.6* 24.3*    Assessment & Plan Post Op Day # 1 . Plan for in 1-2 days. . Repeat CBC at 1600 today. Patient would like to avoid transfusion for symptomatic anemia. . Feed: both, continue daily lactation consult  . Contraception: s/p ppBTL . Keep incision site clean and dry . Circumcision: in patient circ  . Saline Lock as taking good PO.    LOS: 1 day   Wilber Oliphant 04/29/2019, 8:04 AM

## 2019-04-29 NOTE — Lactation Note (Signed)
This note was copied from a baby's chart. Lactation Consultation Note  Patient Name: Paula Sandoval Date: 04/29/2019 Reason for consult: Hyperbilirubinemia;Term  Baby is 35 hours hours old  [place] reviewed doc flow and updated per mom  As LC entered the baby latched on the right breast / football / swallows / and depth noted/ per mom comfortable.  Baby had the bili wrap blanket intact.  Baby finished and LC offered to assist on the left breast / football / and mom latched / LC just assisted with pillows.  LC recommended post pumping due to the bili level increasing and set up with the DEBP .  Per mom has [place] at  Home.     Maternal Data Has patient been taught Hand Expression?: Yes  Feeding Feeding Type: Breast Fed  LATCH Score Latch: Repeated attempts needed to sustain latch, nipple held in mouth throughout feeding, stimulation needed to elicit sucking reflex.  Audible Swallowing: Spontaneous and intermittent  Type of Nipple: Everted at rest and after stimulation  Comfort (Breast/Nipple): Soft / non-tender  Hold (Positioning): Assistance needed to correctly position infant at breast and maintain latch.  LATCH Score: 8  Interventions Interventions: Breast feeding basics reviewed;Assisted with latch;Skin to skin;Breast massage;Hand express;Breast compression;Adjust position;Support pillows;Position options  Lactation Tools Discussed/Used Tools: Pump Breast pump type: Double-Electric Breast Pump Pump Review: Setup, frequency, and cleaning Initiated by:: MAI Date initiated:: 04/29/19   Consult Status Consult Status: Follow-up Date: 04/30/19 Follow-up type: In-patient    [place] 04/29/2019, 11:32 PM

## 2019-04-29 NOTE — Lactation Note (Signed)
This note was copied from a baby's chart. Lactation Consultation Note Baby 48 hrs old. Mom states baby is BF great. States having no painful latches or difficulty latching. Mom stated her 1st child she had issues latching and didn't BF. 2nd child mom BF some but didn't feel the baby was getting enough so switched to just formula. Baby didn't have wt. Loss it was just what mom felt. Mom didn't give a time line of how long she tried BF.  Mom states this baby is feeding well in football position. Encouraged to massage breast at intervals while feeding. Reviewed STS, I&O, cluster feeding, supply and demand. Asked mom if she would like to use DEBP, mom stated maybe tomorrow.  Encouraged mom to call for assistance or questions. Lactation brochure given.  Patient Name: Paula Sandoval [id number] Date: 04/29/2019 Reason for consult: Initial assessment;Term   Maternal Data Has patient been taught Hand Expression?: Yes Does the patient have breastfeeding experience prior to this delivery?: Yes  Feeding Feeding Type: Breast Fed  LATCH Score Latch: Grasps breast easily, tongue down, lips flanged, rhythmical sucking.(already latched)  Audible Swallowing: A few with stimulation  Type of Nipple: Everted at rest and after stimulation  Comfort (Breast/Nipple): Soft / non-tender  Hold (Positioning): No assistance needed to correctly position infant at breast.  LATCH Score: 9  Interventions Interventions: Breast feeding basics reviewed;Position options  Lactation Tools Discussed/Used     Consult Status Consult Status: Follow-up Date: 04/30/19 Follow-up type: In-patient    Theodoro Kalata 04/29/2019, 12:49 AM

## 2019-04-29 NOTE — Progress Notes (Signed)
RN was assisting pt with orthostatic blood pressures. Pt stated she felt "fine" and denied feeling dizzy. Her blood pressures were WNL but her pulse was elevated to 140. Pt had a large emesis (chunky/food). Pt verbalized that the sight of blood makes her sick, pt again denied feeling dizzy. RN encouraged pt to lay back down, encouraged ice chips and sips of water. Will attempt to ambulate at a later time.

## 2019-04-30 MED ORDER — LIDOCAINE 1% INJECTION FOR CIRCUMCISION
0.8000 mL | INJECTION | Freq: Once | INTRAVENOUS | Status: DC
  Filled 2019-04-30: qty 1

## 2019-04-30 MED ORDER — FUROSEMIDE 20 MG PO TABS
20.0000 mg | ORAL_TABLET | Freq: Once | ORAL | Status: AC
  Administered 2019-04-30: 20 mg via ORAL
  Filled 2019-04-30: qty 1

## 2019-04-30 MED ORDER — ACETAMINOPHEN FOR CIRCUMCISION 160 MG/5 ML: 40.0000 mg | ORAL | Status: DC | PRN

## 2019-04-30 MED ORDER — FERROUS SULFATE 325 (65 FE) MG PO TABS
325.0000 mg | ORAL_TABLET | Freq: Two times a day (BID) | ORAL | Status: DC
  Administered 2019-04-30 – 2019-05-01 (×3): 325 mg via ORAL
  Filled 2019-04-30 (×3): qty 1

## 2019-04-30 MED ORDER — WHITE PETROLATUM EX OINT: 1.0000 "application " | TOPICAL_OINTMENT | CUTANEOUS | Status: DC | PRN

## 2019-04-30 MED ORDER — EPINEPHRINE TOPICAL FOR CIRCUMCISION 0.1 MG/ML: 1.0000 [drp] | TOPICAL | Status: DC | PRN

## 2019-04-30 MED ORDER — ACETAMINOPHEN FOR CIRCUMCISION 160 MG/5 ML: 40.0000 mg | Freq: Once | ORAL | Status: DC

## 2019-04-30 MED ORDER — SUCROSE 24% NICU/PEDS ORAL SOLUTION: 0.5000 mL | OROMUCOSAL | Status: DC | PRN

## 2019-04-30 NOTE — Lactation Note (Signed)
This note was copied from a baby's chart. Lactation Consultation Note  Patient Name: Paula Sandoval Date: 04/30/2019 Reason for consult: Follow-up assessment   Baby 45 hours old and mother requested assistance.  Baby on phototherapy. Her L nipple has slight bruising.  Provided mother w/ coconut oil. Mother was willing to latch baby to R nipple. Took pacifier out of infants mouth and provided education.  Baby latched on R breast and demonstrated how to perform chin tug to wide latch. Suggest mother pump for 10 min with hand pump on L side for stimulation since she will skip bf on L side for the next 2 feedings since she is tender. Observed feeding with frequent swallows. Stools have transitioned to green per parents. Feed on demand approximately 8-12 times per day.   Discussed waking techniques for sleepy baby after discharge.   Maternal Data    Feeding Feeding Type: Breast Fed  LATCH Score Latch: Grasps breast easily, tongue down, lips flanged, rhythmical sucking.  Audible Swallowing: A few with stimulation  Type of Nipple: Everted at rest and after stimulation  Comfort (Breast/Nipple): Filling, red/small blisters or bruises, mild/mod discomfort  Hold (Positioning): No assistance needed to correctly position infant at breast.  LATCH Score: 8  Interventions Interventions: Breast feeding basics reviewed;Assisted with latch;Breast compression;Support pillows;Coconut oil;Hand pump;DEBP  Lactation Tools Discussed/Used     Consult Status Consult Status: Follow-up Date: 05/01/19 Follow-up type: In-patient    Vivianne Master [facility] 04/30/2019, 2:03 PM

## 2019-04-30 NOTE — Progress Notes (Signed)
Post Op Day 1  Subjective Up ad lib, voiding, tolerating PO, and + flatus. Denies dizziness, lightheadedness, tachycardia. Appropriate Lochia. Pain well controlled.  Objective BP 123/71 (BP Location: Left Arm)   Pulse (!) 105   Temp 98.3 F (36.8 C) (Oral)   Resp 18   Ht 5\' 9"  (1.753 m)   Wt (!) 161 kg   LMP 06/10/2018 (Exact Date)   SpO2 99%   Breastfeeding Unknown   BMI 52.42 kg/m  Intake/Output      06/29 0701 - 06/30 0700 06/30 0701 - 07/01 0700   I.V. (mL/kg)     Other     IV Piggyback     Total Intake(mL/kg)     Urine (mL/kg/hr) 1525 (0.4)    Emesis/NG output     Blood     Total Output 1525    Net -1525           Physical Exam:  General: alert, cooperative, NAD Lochia: appropriate Uterine Fundus: firm Incision: no significant drainage DVT Evaluation: No evidence of DVT, extremities warm and well perfused.   Recent Labs    04/28/19 0715 04/28/19 1515 04/29/19 0437 04/29/19 1554  HGB 11.3* 9.3* 7.3* 7.5*  HCT 37.8 30.6* 24.3* 25.3*    Assessment & Plan Post Op Day # 1 . Plan for in 1-2 days. . Feed: both, continue daily lactation consult  . Contraception: sp ppBTL . PO Iron for anemia . Keep incision site clean and dry . Circumcision: inpatient if insurance covers (BCBS and medicaid)    LOS: 2 days   Wilber Oliphant 04/30/2019, 8:02 AM

## 2019-04-30 NOTE — Progress Notes (Signed)
Interim Progress Note  Called by RN who reports that patient has swelling in her legs and that patient is experiencing tingling sensations with the swelling   PE:  BP 114/80 (BP Location: Left Arm)   Pulse (!) 103   Temp 98.2 F (36.8 C) (Oral)   Resp 18   Ht 5\' 9"  (1.753 m)   Wt (!) 161 kg   LMP 06/10/2018 (Exact Date)   SpO2 100%   Breastfeeding Unknown   BMI 52.42 kg/m  Lower Extremities: Non pitting edema in bilateral feet. Trace edema just above ankles.    AP:  Likely volume overload due to fluids and recent pregnancy. Patient otherwise voiding normally.  . PO Lasix 20mg  x 1 and elevation of legs.   Zettie Cooley, M.D.  Family Medicine  PGY-1 04/30/2019 3:57 PM

## 2019-05-01 ENCOUNTER — Other Ambulatory Visit (HOSPITAL_COMMUNITY): Payer: Medicaid Other

## 2019-05-01 LAB — TYPE AND SCREEN
ABO/RH(D): O POS
Antibody Screen: NEGATIVE
Unit division: 0
Unit division: 0

## 2019-05-01 LAB — BPAM RBC
Blood Product Expiration Date: 202007032359
Blood Product Expiration Date: 202007032359
ISSUE DATE / TIME: 202006281402
ISSUE DATE / TIME: 202006281402
Unit Type and Rh: 5100
Unit Type and Rh: 5100

## 2019-05-01 MED ORDER — OXYCODONE-ACETAMINOPHEN 5-325 MG PO TABS: 1.0000 | ORAL_TABLET | ORAL | 0 refills | Status: AC | PRN

## 2019-05-01 MED ORDER — IBUPROFEN 800 MG PO TABS: 800.0000 mg | ORAL_TABLET | Freq: Three times a day (TID) | ORAL | 0 refills | Status: AC

## 2019-05-01 NOTE — Discharge Instructions (Signed)
Cesarean Delivery, Care After This sheet gives you information about how to care for yourself after your procedure. Your health care provider may also give you more specific instructions. If you have problems or questions, contact your health care provider. What can I expect after the procedure? After the procedure, it is common to have:  A small amount of blood or clear fluid coming from the incision.  Some redness, swelling, and pain in your incision area.  Some abdominal pain and soreness.  Vaginal bleeding (lochia). Even though you did not have a vaginal delivery, you will still have vaginal bleeding and discharge.  Pelvic cramps.  Fatigue. You may have pain, swelling, and discomfort in the tissue between your vagina and your anus (perineum) if:  Your C-section was unplanned, and you were allowed to labor and push.  An incision was made in the area (episiotomy) or the tissue tore during attempted vaginal delivery. Follow these instructions at home: Incision care   Follow instructions from your health care provider about how to take care of your incision. Make sure you: ? Wash your hands with soap and water before you change your bandage (dressing). If soap and water are not available, use hand sanitizer. ? If you have a dressing, change it or remove it as told by your health care provider. ? Leave stitches (sutures), skin staples, skin glue, or adhesive strips in place. These skin closures may need to stay in place for 2 weeks or longer. If adhesive strip edges start to loosen and curl up, you may trim the loose edges. Do not remove adhesive strips completely unless your health care provider tells you to do that.  Check your incision area every day for signs of infection. Check for: ? More redness, swelling, or pain. ? More fluid or blood. ? Warmth. ? Pus or a bad smell.  Do not take baths, swim, or use a hot tub until your health care provider says it's okay. Ask your health  care provider if you can take showers.  When you cough or sneeze, hug a pillow. This helps with pain and decreases the chance of your incision opening up (dehiscing). Do this until your incision heals. Medicines  Take over-the-counter and prescription medicines only as told by your health care provider.  If you were prescribed an antibiotic medicine, take it as told by your health care provider. Do not stop taking the antibiotic even if you start to feel better.  Do not drive or use heavy machinery while taking prescription pain medicine. Lifestyle  Do not drink alcohol. This is especially important if you are breastfeeding or taking pain medicine.  Do not use any products that contain nicotine or tobacco, such as cigarettes, e-cigarettes, and chewing tobacco. If you need help quitting, ask your health care provider. Eating and drinking  Drink at least 8 eight-ounce glasses of water every day unless told not to by your health care provider. If you breastfeed, you may need to drink even more water.  Eat high-fiber foods every day. These foods may help prevent or relieve constipation. High-fiber foods include: ? Whole grain cereals and breads. ? Brown rice. ? Beans. ? Fresh fruits and vegetables. Activity   If possible, have someone help you care for your baby and help with household activities for at least a few days after you leave the hospital.  Return to your normal activities as told by your health care provider. Ask your health care provider what activities are safe for  you.  Rest as much as possible. Try to rest or take a nap while your baby is sleeping.  Do not lift anything that is heavier than 10 lbs (4.5 kg), or the limit that you were told, until your health care provider says that it is safe.  Talk with your health care provider about when you can engage in sexual activity. This may depend on your: ? Risk of infection. ? How fast you heal. ? Comfort and desire to  engage in sexual activity. General instructions  Do not use tampons or douches until your health care provider approves.  Wear loose, comfortable clothing and a supportive and well-fitting bra.  Keep your perineum clean and dry. Wipe from front to back when you use the toilet.  If you pass a blood clot, save it and call your health care provider to discuss. Do not flush blood clots down the toilet before you get instructions from your health care provider.  Keep all follow-up visits for you and your baby as told by your health care provider. This is important. Contact a health care provider if:  You have: ? A fever. ? Bad-smelling vaginal discharge. ? Pus or a bad smell coming from your incision. ? Difficulty or pain when urinating. ? A sudden increase or decrease in the frequency of your bowel movements. ? More redness, swelling, or pain around your incision. ? More fluid or blood coming from your incision. ? A rash. ? Nausea. ? Little or no interest in activities you used to enjoy. ? Questions about caring for yourself or your baby.  Your incision feels warm to the touch.  Your breasts turn red or become painful or hard.  You feel unusually sad or worried.  You vomit.  You pass a blood clot from your vagina.  You urinate more than usual.  You are dizzy or light-headed. Get help right away if:  You have: ? Pain that does not go away or get better with medicine. ? Chest pain. ? Difficulty breathing. ? Blurred vision or spots in your vision. ? Thoughts about hurting yourself or your baby. ? New pain in your abdomen or in one of your legs. ? A severe headache.  You faint.  You bleed from your vagina so much that you fill more than one sanitary pad in one hour. Bleeding should not be heavier than your heaviest period. Summary  After the procedure, it is common to have pain at your incision site, abdominal cramping, and slight bleeding from your vagina.  Check  your incision area every day for signs of infection.  Tell your health care provider about any unusual symptoms.  Keep all follow-up visits for you and your baby as told by your health care provider. This information is not intended to replace advice given to you by your health care provider. Make sure you discuss any questions you have with your health care provider. Document Released: 07/09/2002 Document Revised: 04/25/2018 Document Reviewed: 04/25/2018 Elsevier Patient Education  2020 [employer].

## 2019-05-02 ENCOUNTER — Ambulatory Visit: Payer: Self-pay

## 2019-05-02 NOTE — Lactation Note (Signed)
This note was copied from a baby's chart. Lactation Consultation Note  Patient Name: Paula Sandoval Date: 05/02/2019 Reason for consult: Follow-up assessment   P3, Baby 71 hours old and off phototherapy. Baby sucking pacifier.  Provided education. Mother latched baby in football hold.  She states her soreness has improved. Intermittent swallows observed. Stools transitioning and breasts feel fuller. Feed on demand approximately 8-12 times per day.   Reviewed engorgement care and monitoring voids/stools. Discussed waking baby for feedings if needed.   Maternal Data    Feeding Feeding Type: Breast Fed  LATCH Score Latch: Grasps breast easily, tongue down, lips flanged, rhythmical sucking.  Audible Swallowing: A few with stimulation  Type of Nipple: Everted at rest and after stimulation  Comfort (Breast/Nipple): Soft / non-tender  Hold (Positioning): No assistance needed to correctly position infant at breast.  LATCH Score: 9  Interventions Interventions: Breast feeding basics reviewed  Lactation Tools Discussed/Used     Consult Status Consult Status: Complete Date: 05/02/19    Vivianne Master [facility] 05/02/2019, 9:40 AM

## 2019-05-03 ENCOUNTER — Inpatient Hospital Stay (HOSPITAL_COMMUNITY)
Admission: AD | Admit: 2019-05-03 | Payer: BC Managed Care – PPO | Source: Home / Self Care | Admitting: Obstetrics & Gynecology

## 2019-05-03 ENCOUNTER — Inpatient Hospital Stay (HOSPITAL_COMMUNITY): Payer: BC Managed Care – PPO

## 2019-05-16 ENCOUNTER — Ambulatory Visit (INDEPENDENT_AMBULATORY_CARE_PROVIDER_SITE_OTHER): Payer: BC Managed Care – PPO | Admitting: Obstetrics and Gynecology

## 2019-05-16 ENCOUNTER — Encounter: Payer: Self-pay | Admitting: Obstetrics and Gynecology

## 2019-05-16 ENCOUNTER — Other Ambulatory Visit: Payer: Self-pay

## 2019-05-16 VITALS — BP 112/74 | HR 98 | Wt 330.2 lb

## 2019-05-16 DIAGNOSIS — Z9889 Other specified postprocedural states: Secondary | ICD-10-CM

## 2019-05-16 NOTE — Progress Notes (Signed)
Pt is in the office for incision check, c section on 04-28-19. Pt denies any problems with incision. Edinburgh= 0

## 2019-05-16 NOTE — Progress Notes (Signed)
    Post Cesarean Section Incision Check  Paula Sandoval is a 28 y.o. African-American G3P3003 (No LMP recorded.) who is s/p RCS+ BTL on 04/28/19 at 40 weeks for non-reassuring fetal status, prior uterine incision, unwanted fertility. She was discharged to home on POD#4. Pregnancy complicated by asthma, obesity.  Complains of nothing. Reports appetite not quite back to normal but she is not having nausea/vomiting. She is having no issues with going to the restroom. No pain, overall she is feeling well.   Physical Exam:  BP 112/74   Pulse 98   Wt (!) 330 lb 3.2 oz (149.8 kg)   Breastfeeding Yes   BMI 48.76 kg/m  Body mass index is 48.76 kg/m. General appearance: Well nourished, well developed female in no acute distress.  Abdomen: soft, appropriately tender, incision clean, dry, intact with no signs of active bleeding or infection   Assessment: Patient is a 28 y.o. [id number] who is 3 weeks post partum from a repeat c-section + bilateral tubal ligation. She is doing well. Incision appears to be healing appropriately.  Plan:  Follow up 2 weeks for post partum visit.   Feliz Beam, M.D. Attending Center for [facility])

## 2019-05-29 ENCOUNTER — Ambulatory Visit (INDEPENDENT_AMBULATORY_CARE_PROVIDER_SITE_OTHER): Payer: BC Managed Care – PPO | Admitting: Obstetrics

## 2019-05-29 ENCOUNTER — Encounter: Payer: Self-pay | Admitting: Obstetrics

## 2019-05-29 DIAGNOSIS — Z1389 Encounter for screening for other disorder: Secondary | ICD-10-CM | POA: Diagnosis not present

## 2019-05-29 NOTE — Progress Notes (Signed)
TELEHEALTH POSTPARTUM VIRTUAL VIDEO VISIT ENCOUNTER NOTE   Provider location: Center for [facility] at [place]   I connected with [place] on 05/29/19 at 11:00 AM EDT by WebEx Video Encounter at home and verified that I am speaking with the correct person using two identifiers.    I discussed the limitations, risks, security and privacy concerns of performing an evaluation and management service virtually and the availability of in person appointments. I also discussed with the patient that there may be a patient responsible charge related to this service. The patient expressed understanding and agreed to proceed.  Chief Complaint: Postpartum Visit  History of Present Illness: [place] N Pendergraft is a 28 y.o. African-American G3P3003 being evaluated for postpartum followup.    She is s/p primary cesarean section on 04/28/19 at 40 weeks; she was discharged to home on 05/01/2019. Pregnancy complicated by none. Baby is doing well.  Complains of possible decreased milk production.  Vaginal bleeding or discharge: No  Intercourse: No  Contraception: bilateral tubal ligation Mode of feeding infant: Breast.  PP depression s/s: No .  Any bowel or bladder issues: No  Pap smear: no abnormalities (date: 10/22/18)  Review of Systems: Positive for none. Her 12 point review of systems is negative or as noted in the History of Present Illness.  Patient Active Problem List   Diagnosis Date Noted  . Indication for care in labor or delivery 04/28/2019  . Unwanted fertility 04/28/2019  . History of VBAC 04/10/2019  . Group B streptococcal carriage complicating pregnancy 48/54/6270  . Encounter for supervision of normal pregnancy, antepartum 10/22/2018  . Previous cesarean delivery, antepartum condition or complication 35/00/9381  . Morbid obesity with BMI of 45.0-49.9, adult ([place]) 05/26/2016    Medications Tresa N. Mayweather had no medications administered during this visit. Current  Outpatient Medications  Medication Sig Dispense Refill  . ibuprofen (ADVIL) 800 MG tablet Take 1 tablet (800 mg total) by mouth every 8 (eight) hours. (Patient not taking: Reported on 05/16/2019) 30 tablet 0  . oxyCODONE-acetaminophen (PERCOCET) 5-325 MG tablet Take 1 tablet by mouth every 4 (four) hours as needed for severe pain. (Patient not taking: Reported on 05/16/2019) 15 tablet 0  . Prenatal Vit-Fe Fumarate-FA (PRENATAL MULTIVITAMIN) TABS tablet Take 1 tablet by mouth daily at 12 noon. (Patient not taking: Reported on 05/16/2019) 60 tablet 1   No current facility-administered medications for this visit.     Allergies Patient has no known allergies.  Physical Exam:  There were no vitals taken for this visit.   General:  Alert, oriented and cooperative. Patient is in no acute distress.  Mental Status: Normal mood and affect. Normal behavior. Normal judgment and thought content.   Respiratory: Normal respiratory effort noted, no problems with respiration noted  Rest of physical exam deferred due to type of encounter  PP Depression Screening:   Edinburgh Postnatal Depression Scale Screening Tool 05/16/2019 04/29/2019 04/29/2019  I have been able to laugh and see the funny side of things. 0 0 (No Data)  I have looked forward with enjoyment to things. 0 0 -  I have blamed myself unnecessarily when things went wrong. 0 0 -  I have been anxious or worried for no good reason. 0 1 -  I have felt scared or panicky for no good reason. 0 0 -  Things have been getting on top of me. 0 0 -  I have been so unhappy that I have had difficulty sleeping. 0  0 -  I have felt sad or miserable. 0 0 -  I have been so unhappy that I have been crying. 0 0 -  The thought of harming myself has occurred to me. 0 0 -  Edinburgh Postnatal Depression Scale Total 0 1 -     Assessment:Patient is a 28 y.o. [id number] who is 4 weeks postpartum from a repeat cesarean section and tubal ligation was performed.  She is doing  well.   Plan:  1. Postpartum care following cesarean delivery / BTL - doing well  RTC 5 months for annual.  I discussed the assessment and treatment plan with the patient. The patient was provided an opportunity to ask questions and all were answered. The patient agreed with the plan and demonstrated an understanding of the instructions.   The patient was advised to call back or seek an in-person evaluation/go to the ED for any concerning postpartum symptoms.  I provided 10 minutes of face-to-face time during this encounter.   Cleotilde Neer, RN / Shelly Bombard MD Center for [facility] 05-29-2019

## 2019-06-04 NOTE — Progress Notes (Unsigned)
Erroneous encounter

## 2019-06-13 IMAGING — US US MFM OB FOLLOW UP
1 series · 14 of 28 positions shown · non-contrast
Comparison: none

[Series 1: us mfm ob follow up · 57 acquisitions, 14 frames shown]
[im 3/57]
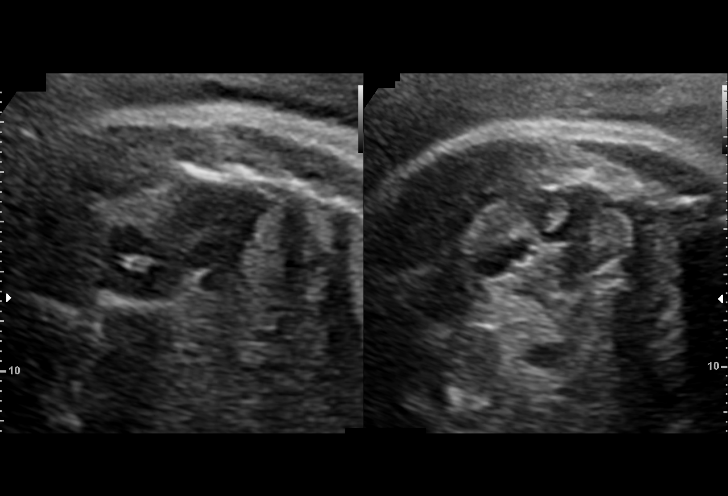
[im 7/57]
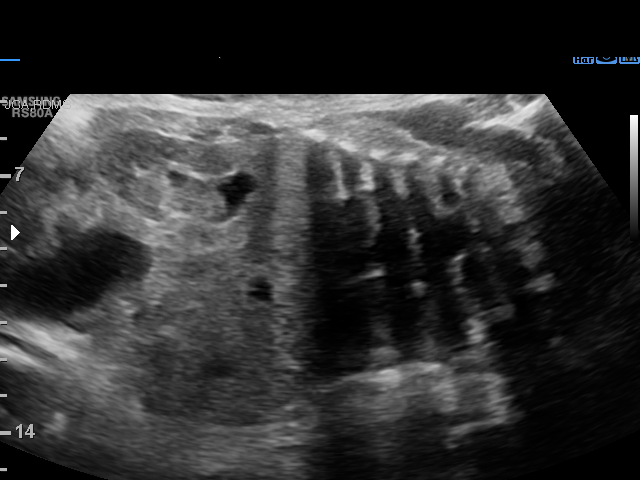
[im 11/57]
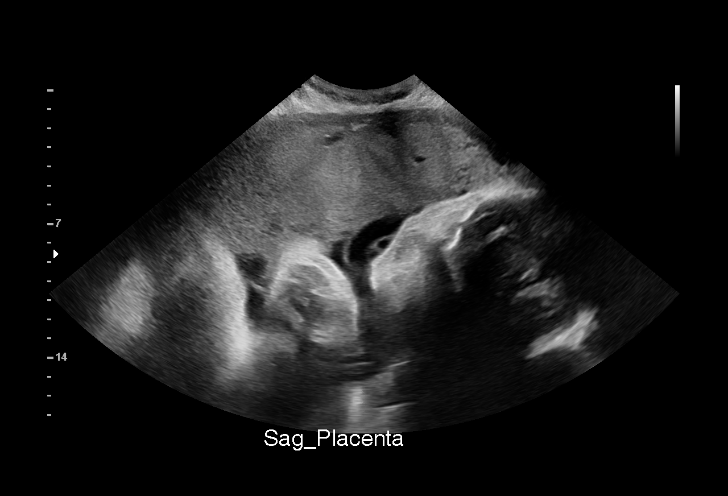
[im 15/57]
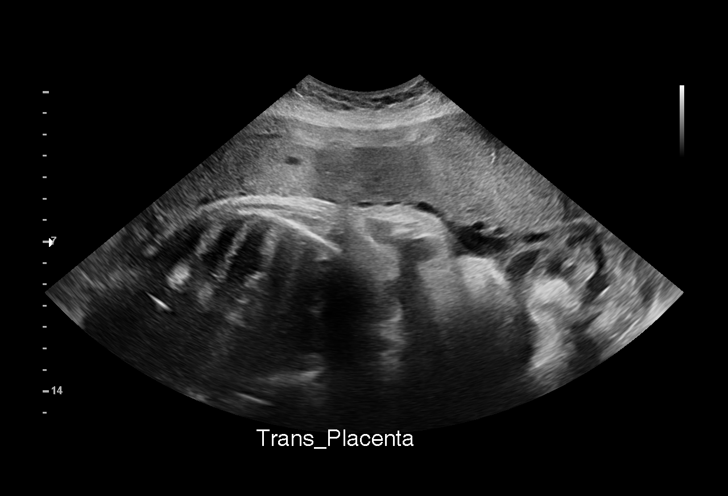
[im 19/57]
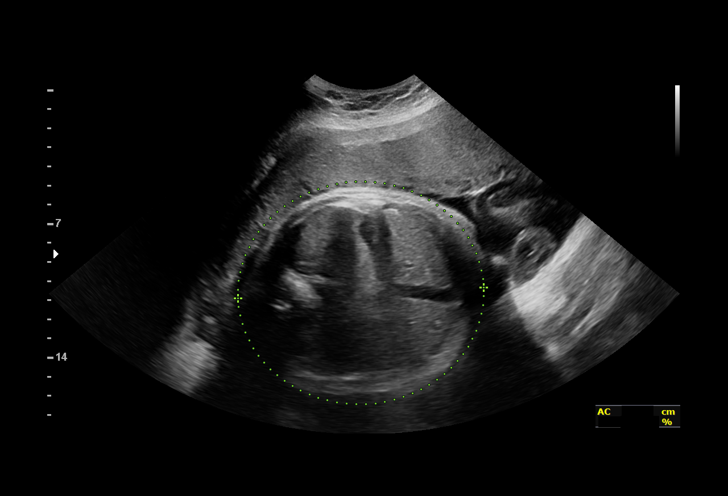
[im 23/57]
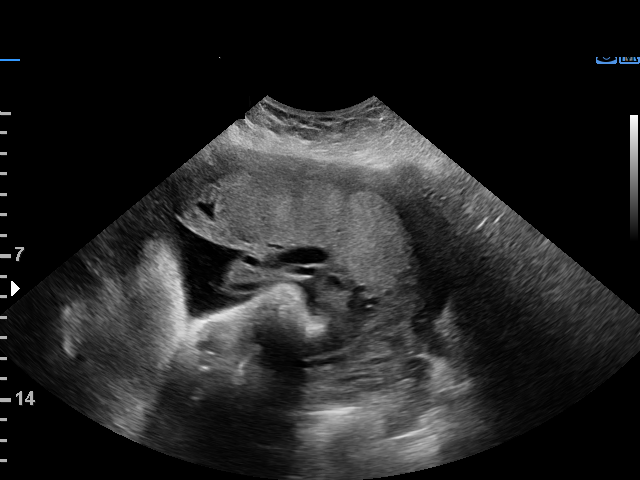
[im 27/57]
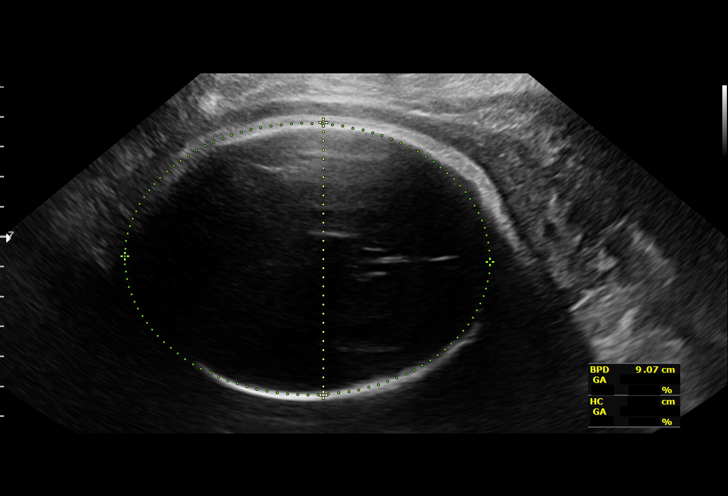
[im 32/57]
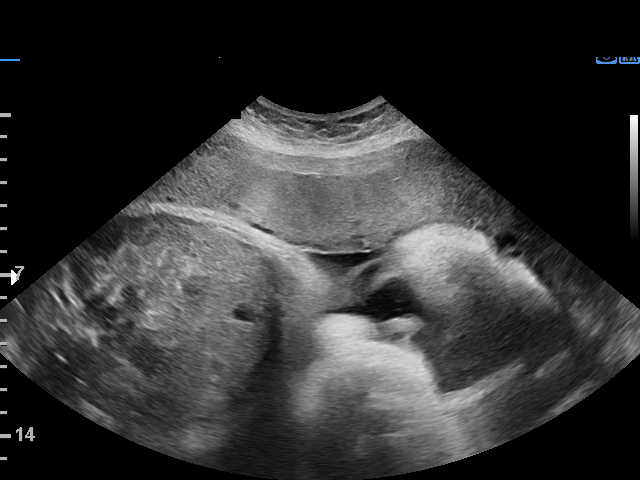
[im 36/57]
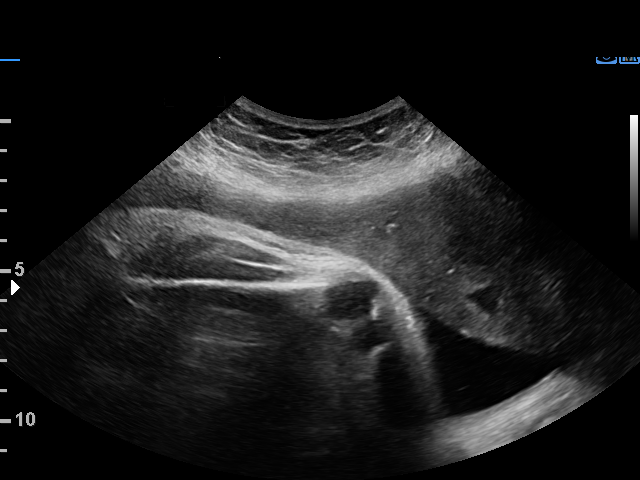
[im 40/57]
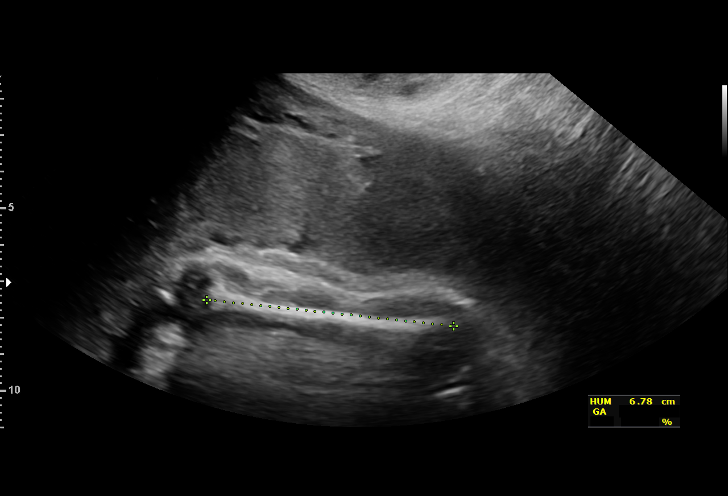
[im 44/57]
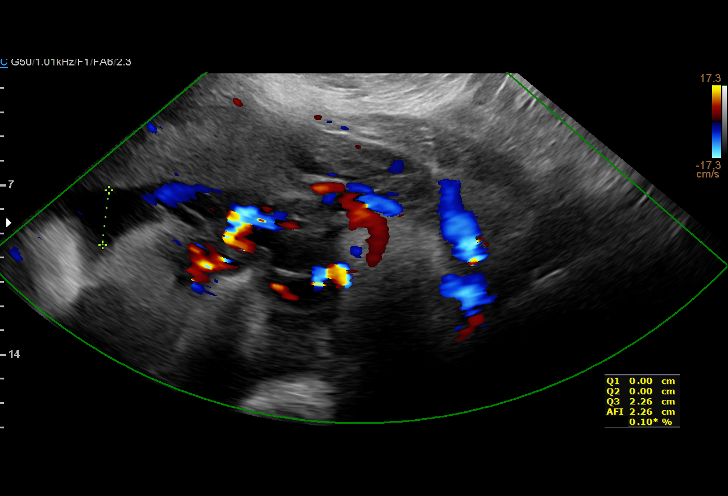
[im 48/57]
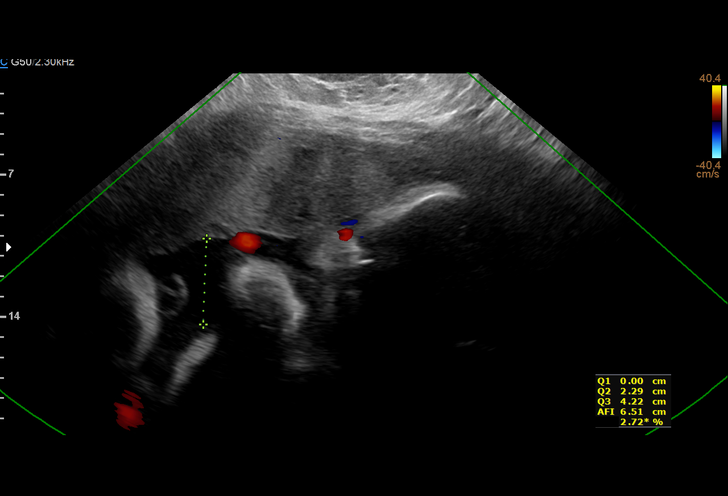
[im 52/57]
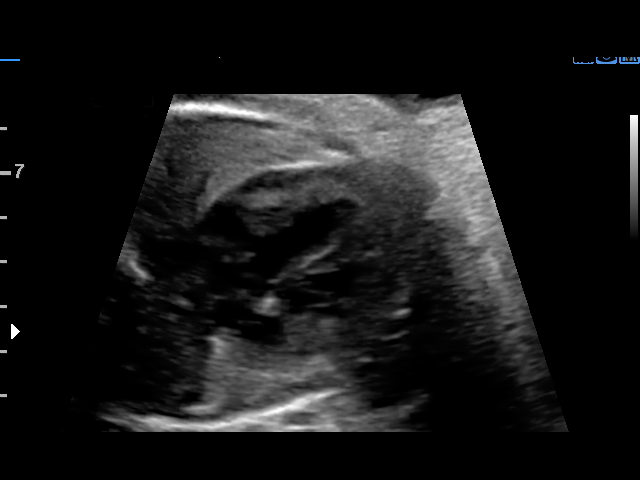
[im 57/57]
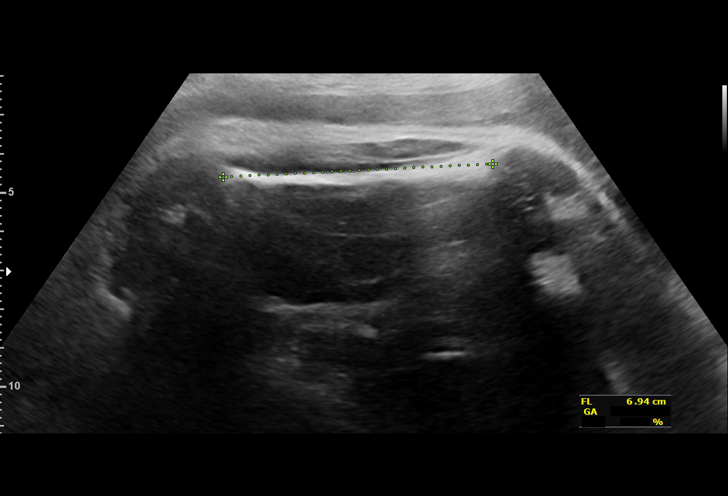

[14 of 28 positions shown; findings below may reference images not displayed]

STRESS                                            MACON
                                                       MACON
 ----------------------------------------------------------------------

 ----------------------------------------------------------------------
Indications

  Maternal morbid obesity(BMI 47)
  History of cesarean delivery, currently
  pregnant
  Asthma                                         [id number].[id number]
  Encounter for other antenatal screening
  follow-up (NIPS- Insuff DNA, Neg QatQF,
  AFP neg)
  36 weeks gestation of pregnancy
 ----------------------------------------------------------------------
Vital Signs

 BMI:
Fetal Evaluation

 Num Of Fetuses:          1
 Fetal Heart Rate(bpm):   133
 Cardiac Activity:        Observed
 Presentation:            Cephalic
 Placenta:                Anterior
 P. Cord Insertion:       Visualized

 Amniotic Fluid
 AFI FV:      Within normal limits

 AFI Sum(cm)     %Tile       Largest Pocket(cm)
 7.21            4
 RUQ(cm)       RLQ(cm)       LUQ(cm)        LLQ(cm)
 0
Biophysical Evaluation

 Amniotic F.V:   Within normal limits       F. Tone:         Observed
 F. Movement:    Observed                   Score:           [DATE]
 F. Breathing:   Observed
Biometry

 BPD:      90.1  mm     G. Age:  36w 4d         54  %    CI:        70.05   %    70 - 86
                                                         FL/HC:       20.2  %    20.8 -
 HC:      343.4  mm     G. Age:  39w 4d         85  %    HC/AC:       0.91       0.92 -
 AC:      379.1  mm     G. Age:  41w 6d       > 97  %    FL/BPD:      77.0  %    71 - 87
 FL:       69.4  mm     G. Age:  35w 4d         20  %    FL/AC:       18.3  %    20 - 24
 HUM:      66.9  mm     G. Age:  38w 6d       > 95  %

 Est. FW:    6754   gm     8 lb 8 oz   > 90  %
OB History

 Gravidity:    3         Term:   2
 Living:       2
Gestational Age

 LMP:           42w 3d        Date:  06/10/18                 EDD:   03/17/19
 U/S Today:     38w 3d                                        EDD:   04/14/19
Anatomy

 Cranium:               Appears normal         Aortic Arch:            Previously seen
 Cavum:                 Appears normal         Ductal Arch:            Previously seen
 Ventricles:            Previously seen        Diaphragm:              Appears normal
 Choroid Plexus:        Previously seen        Stomach:                Appears normal, left
                                                                       sided
 Cerebellum:            Previously seen        Abdomen:                Appears normal
 Posterior Fossa:       Previously seen        Abdominal Wall:         Previously seen
 Nuchal Fold:           Previously seen        Cord Vessels:           Previously seen
 Face:                  Orbits previously      Kidneys:                Appear normal
                        seen
 Lips:                  Not well visualized    Bladder:                Appears normal
 Thoracic:              Appears normal         Spine:                  Not well visualized
 Heart:                 Appears normal         Upper Extremities:      Previously seen
                        (4CH, axis, and
                        situs)
 RVOT:                  Previously seen        Lower Extremities:      Previously seen
 LVOT:                  Not well visualized

 Other:  Technically difficult due to maternal habitus and fetal position.
Impression

 Amniotic fluid is normal and good fetal activity is seen. Fetal
 growth is appropriate for gestational age. Antenatal testing is
 reassuring. BPP [DATE].
Recommendations

 Follow-up as clinically indicated.
                 Tiger, Rinnah

## 2019-11-04 ENCOUNTER — Ambulatory Visit: Payer: BC Managed Care – PPO | Attending: Internal Medicine

## 2019-11-04 DIAGNOSIS — U071 COVID-19: Secondary | ICD-10-CM

## 2019-11-05 LAB — NOVEL CORONAVIRUS, NAA: SARS-CoV-2, NAA: NOT DETECTED

## 2021-04-06 ENCOUNTER — Other Ambulatory Visit: Payer: Self-pay

## 2021-04-06 ENCOUNTER — Encounter (HOSPITAL_COMMUNITY): Payer: Self-pay | Admitting: Emergency Medicine

## 2021-04-06 ENCOUNTER — Ambulatory Visit (HOSPITAL_COMMUNITY)
Admission: EM | Admit: 2021-04-06 | Discharge: 2021-04-06 | Disposition: A | Discharge status: Home / Self Care | Payer: Managed Care, Other (non HMO) | Attending: Internal Medicine | Admitting: Internal Medicine

## 2021-04-06 DIAGNOSIS — J029 Acute pharyngitis, unspecified: Secondary | ICD-10-CM | POA: Diagnosis not present

## 2021-04-06 LAB — POCT RAPID STREP A, ED / UC: Streptococcus, Group A Screen (Direct): NEGATIVE

## 2021-04-06 NOTE — ED Provider Notes (Signed)
[place]    CSN: [id number] Arrival date & time: 04/06/21  0818      History   Chief Complaint Chief Complaint  Patient presents with  . Cough    HPI Paula Sandoval is a 30 y.o. female.   Patient here for evaluation of sore throat that has been ongoing for the past several days.  Reports throat has gotten increasingly worse and is now painful to swallow.  Denies any recent sick contacts.  Denies any nasal congestion but has developed a cough in the past few days.  Denies any trauma, injury, or other precipitating event. Denies any fevers, chest pain, shortness of breath, N/V/D, numbness, tingling, weakness, abdominal pain, or headaches.    The history is provided by the patient.  Cough Associated symptoms: sore throat   Associated symptoms: no fever and no rhinorrhea     Past Medical History:  Diagnosis Date  . Asthma   . 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 Indication for care in labor or delivery 04/28/2019  . Unwanted fertility 04/28/2019  . History of VBAC 04/10/2019  . Group B streptococcal carriage complicating pregnancy 43/15/4008  . Encounter for supervision of normal pregnancy, antepartum 10/22/2018  . Previous cesarean delivery, antepartum condition or complication 67/61/9509  . Morbid obesity with BMI of 45.0-49.9, adult ([place]) 05/26/2016    Past Surgical History:  Procedure Laterality Date  . CESAREAN SECTION  02/22/2012   Procedure: CESAREAN SECTION;  Surgeon: Frederico Hamman, MD;  Location: [facility];  Service: Gynecology;  Laterality: N/A;  . CESAREAN SECTION N/A 04/28/2019   Procedure: CESAREAN SECTION;  Surgeon: Sloan Leiter, MD;  Location: [facility];  Service: Obstetrics;  Laterality: N/A;  . excision of tumor right leg  2009    OB History    Gravida  3   Para  3   Term  3   Preterm      AB      Living  3     SAB      IAB      Ectopic      Multiple  0   Live  Births  3            Home Medications    Prior to Admission medications   Medication Sig Start Date End Date Taking? Authorizing Provider  Dextromethorphan HBr (DELSYM PO) Take by mouth.   Yes [provider]  ibuprofen (ADVIL) 800 MG tablet Take 1 tablet (800 mg total) by mouth every 8 (eight) hours. Patient not taking: Reported on 05/16/2019 05/01/19   Truett Mainland, DO  Prenatal Vit-Fe Fumarate-FA (PRENATAL MULTIVITAMIN) TABS tablet Take 1 tablet by mouth daily at 12 noon. Patient not taking: Reported on 05/16/2019 12/29/16   Emily Filbert, MD    Family History Family History  Problem Relation Age of Onset  . Cancer Maternal Grandmother   . Anesthesia problems Neg Hx   . Hypotension Neg Hx   . Malignant hyperthermia Neg Hx   . Pseudochol deficiency Neg Hx     Social History Social History   Tobacco Use  . Smoking status: Former Smoker    Types: Cigarettes    Quit date: 2008    Years since quitting: 14.4  . Smokeless tobacco: Never Used  Vaping Use  . Vaping Use: Never used  Substance Use Topics  . Alcohol use: No  .  Drug use: No     Allergies   Patient has no known allergies.   Review of Systems Review of Systems  Constitutional: Negative for fever.  HENT: Positive for sore throat and trouble swallowing. Negative for congestion, postnasal drip and rhinorrhea.   Respiratory: Positive for cough.      Physical Exam Triage Vital Signs ED Triage Vitals  Enc Vitals Group     BP 04/06/21 0851 122/87     Pulse Rate 04/06/21 0851 85     Resp 04/06/21 0851 20     Temp 04/06/21 0851 98.7 F (37.1 C)     Temp Source 04/06/21 0851 Oral     SpO2 04/06/21 0851 97 %     Weight --      Height --      Head Circumference --      Peak Flow --      Pain Score 04/06/21 0848 6     Pain Loc --      Pain Edu? --      Excl. in [place]? --    No data found.  Updated Vital Signs BP 122/87 (BP Location: Left Arm)   Pulse 85   Temp 98.7 F (37.1 C) (Oral)    Resp 20   LMP 04/06/2021   SpO2 97%   Visual Acuity Right Eye Distance:   Left Eye Distance:   Bilateral Distance:    Right Eye Near:   Left Eye Near:    Bilateral Near:     Physical Exam Vitals and nursing note reviewed.  Constitutional:      General: She is not in acute distress.    Appearance: Normal appearance. She is not ill-appearing, toxic-appearing or diaphoretic.  HENT:     Head: Normocephalic and atraumatic.     Nose: Nose normal. No congestion.     Mouth/Throat:     Pharynx: Pharyngeal swelling and posterior oropharyngeal erythema present.     Tonsils: No tonsillar exudate. 2+ on the right. 2+ on the left.  Eyes:     Conjunctiva/sclera: Conjunctivae normal.  Cardiovascular:     Rate and Rhythm: Normal rate.     Pulses: Normal pulses.  Pulmonary:     Effort: Pulmonary effort is normal.  Abdominal:     General: Abdomen is flat.  Musculoskeletal:        General: Normal range of motion.     Cervical back: Normal range of motion.  Skin:    General: Skin is warm and dry.  Neurological:     General: No focal deficit present.     Mental Status: She is alert and oriented to person, place, and time.  Psychiatric:        Mood and Affect: Mood normal.      UC Treatments / Results  Labs (all labs ordered are listed, but only abnormal results are displayed) Labs Reviewed  CULTURE, GROUP A STREP [facility])  POCT RAPID STREP A, ED / UC    EKG   Radiology No results found.  Procedures Procedures (including critical care time)  Medications Ordered in UC Medications - No data to display  Initial Impression / Assessment and Plan / UC Course  I have reviewed the triage vital signs and the nursing notes.  Pertinent labs & imaging results that were available during my care of the patient were reviewed by me and considered in my medical decision making (see chart for details).    Assessment negative for red flags or concerns.  Rapid strep negative, throat  culture pending.  Likely viral pharyngitis.  May take ibuprofen and/or Tylenol as needed for fever and pain.  Discussed conservative symptom management as described in discharge instructions.  Follow-up with primary care as needed. Final Clinical Impressions(s) / UC Diagnoses   Final diagnoses:  Acute pharyngitis, unspecified etiology     Discharge Instructions     You most likely have viral illness.   You can take Tylenol and/or Ibuprofen as needed for fever reduction and pain relief.    For cough: honey 1/2 to 1 teaspoon (you can dilute the honey in water or another fluid).  You can also use guaifenesin and dextromethorphan for cough. You can use a humidifier for chest congestion and cough.  If you don't have a humidifier, you can sit in the bathroom with the hot shower running.     For sore throat: try warm salt water gargles, cepacol lozenges, throat spray, warm tea or water with lemon/honey, popsicles or ice, or OTC cold relief medicine for throat discomfort.    For congestion: take a daily anti-histamine like Zyrtec, Claritin, and a oral decongestant, such as pseudoephedrine.  You can also use Flonase 1-2 sprays in each nostril daily.    It is important to stay hydrated: drink plenty of fluids (water, gatorade/powerade/pedialyte, juices, or teas) to keep your throat moisturized and help further relieve irritation/discomfort.   Return or go to the Emergency Department if symptoms worsen or do not improve in the next few days.     ED Prescriptions    None     PDMP not reviewed this encounter.   Pearson Forster, NP 04/06/21 [phone]

## 2021-04-06 NOTE — ED Notes (Signed)
Sample is processing

## 2021-04-06 NOTE — Discharge Instructions (Addendum)
You most likely have viral illness.   You can take Tylenol and/or Ibuprofen as needed for fever reduction and pain relief.    For cough: honey 1/2 to 1 teaspoon (you can dilute the honey in water or another fluid).  You can also use guaifenesin and dextromethorphan for cough. You can use a humidifier for chest congestion and cough.  If you don't have a humidifier, you can sit in the bathroom with the hot shower running.     For sore throat: try warm salt water gargles, cepacol lozenges, throat spray, warm tea or water with lemon/honey, popsicles or ice, or OTC cold relief medicine for throat discomfort.    For congestion: take a daily anti-histamine like Zyrtec, Claritin, and a oral decongestant, such as pseudoephedrine.  You can also use Flonase 1-2 sprays in each nostril daily.    It is important to stay hydrated: drink plenty of fluids (water, gatorade/powerade/pedialyte, juices, or teas) to keep your throat moisturized and help further relieve irritation/discomfort.   Return or go to the Emergency Department if symptoms worsen or do not improve in the next few days.

## 2021-04-06 NOTE — ED Triage Notes (Signed)
Thursday started with sore throat.  Patient has a cough.  denies runny nose, headache and fever

## 2021-04-08 LAB — CULTURE, GROUP A STREP (THRC)

## 2024-10-30 DIAGNOSIS — J45909 Unspecified asthma, uncomplicated: Secondary | ICD-10-CM | POA: Insufficient documentation

## 2024-10-30 DIAGNOSIS — K029 Dental caries, unspecified: Secondary | ICD-10-CM | POA: Insufficient documentation

## 2024-10-30 DIAGNOSIS — K0889 Other specified disorders of teeth and supporting structures: Secondary | ICD-10-CM | POA: Diagnosis present

## 2024-10-31 ENCOUNTER — Other Ambulatory Visit: Payer: Self-pay

## 2024-10-31 ENCOUNTER — Emergency Department (HOSPITAL_COMMUNITY)
Admission: EM | Admit: 2024-10-31 | Discharge: 2024-10-31 | Disposition: A | Discharge status: Home / Self Care | Attending: Emergency Medicine | Admitting: Emergency Medicine

## 2024-10-31 DIAGNOSIS — K0889 Other specified disorders of teeth and supporting structures: Secondary | ICD-10-CM

## 2024-10-31 MED ORDER — NAPROXEN 500 MG PO TABS: 500.0000 mg | ORAL_TABLET | Freq: Two times a day (BID) | ORAL | 0 refills | Status: AC

## 2024-10-31 MED ORDER — AMOXICILLIN-POT CLAVULANATE 875-125 MG PO TABS: 1.0000 | ORAL_TABLET | Freq: Two times a day (BID) | ORAL | 0 refills | Status: AC

## 2024-10-31 MED ORDER — OXYCODONE-ACETAMINOPHEN 5-325 MG PO TABS
1.0000 | ORAL_TABLET | Freq: Once | ORAL | Status: AC
  Administered 2024-10-31: 1 via ORAL
  Filled 2024-10-31: qty 1

## 2024-10-31 MED ORDER — AMOXICILLIN-POT CLAVULANATE 875-125 MG PO TABS
1.0000 | ORAL_TABLET | Freq: Once | ORAL | Status: AC
  Administered 2024-10-31: 1 via ORAL
  Filled 2024-10-31: qty 1

## 2024-10-31 NOTE — ED Triage Notes (Signed)
 Patient reports left lower molar pain for 2 days .

## 2024-10-31 NOTE — ED Provider Notes (Signed)
 " [place] EMERGENCY DEPARTMENT AT [facility] Provider Note   CSN: [id number] Arrival date & time: 10/30/24  2351     Patient presents with: Dental Pain   Paula Sandoval is a 34 y.o. female with history of asthma, tumors.  Presents to ED for evaluation of left lower dental pain.  States that she has had left lower dental pain for 2 days.  Has not seen dentistry in quite some time, possibly over 2 years.  Denies fevers, nausea or vomiting at home.  Reports she has been taking Tylenol  for pain.  Currently menstrual cycle.   Dental Pain      Prior to Admission medications  Medication Sig Start Date End Date Taking? Authorizing Provider  amoxicillin-clavulanate (AUGMENTIN) 875-125 MG tablet Take 1 tablet by mouth every 12 (twelve) hours. 10/31/24  Yes Ruthell Lonni FALCON, PA-C  naproxen (NAPROSYN) 500 MG tablet Take 1 tablet (500 mg total) by mouth 2 (two) times daily. 10/31/24  Yes Ruthell Lonni FALCON, PA-C  Dextromethorphan HBr (DELSYM PO) Take by mouth.    [provider]  ibuprofen  (ADVIL ) 800 MG tablet Take 1 tablet (800 mg total) by mouth every 8 (eight) hours. Patient not taking: Reported on 05/16/2019 05/01/19   Stinson, Jacob J, DO  Prenatal Vit-Fe Fumarate-FA (PRENATAL MULTIVITAMIN) TABS tablet Take 1 tablet by mouth daily at 12 noon. Patient not taking: Reported on 05/16/2019 12/29/16   Dove, Myra C, MD    Allergies: Patient has no known allergies.    Review of Systems  HENT:  Positive for dental problem.   All other systems reviewed and are negative.   Updated Vital Signs BP (!) 151/103 (BP Location: Right Arm)   Pulse 85   Temp 98.9 F (37.2 C) (Oral)   Resp 20   SpO2 99%   Physical Exam Vitals and nursing note reviewed.  Constitutional:      General: She is not in acute distress.    Appearance: She is well-developed.  HENT:     Head: Normocephalic and atraumatic.     Mouth/Throat:     Comments: Dental caries to left lower teeth.  No abscess  needing drainage.  Uvula midline.  Handling secretions appropriately.  No drooling or change to phonation. Eyes:     Conjunctiva/sclera: Conjunctivae normal.  Cardiovascular:     Rate and Rhythm: Normal rate and regular rhythm.     Heart sounds: No murmur heard. Pulmonary:     Effort: Pulmonary effort is normal. No respiratory distress.     Breath sounds: Normal breath sounds.  Abdominal:     Palpations: Abdomen is soft.     Tenderness: There is no abdominal tenderness.  Musculoskeletal:        General: No swelling.     Cervical back: Neck supple.  Skin:    General: Skin is warm and dry.     Capillary Refill: Capillary refill takes less than 2 seconds.  Neurological:     Mental Status: She is alert and oriented to person, place, and time. Mental status is at baseline.  Psychiatric:        Mood and Affect: Mood normal.     (all labs ordered are listed, but only abnormal results are displayed) Labs Reviewed - No data to display  EKG: None  Radiology: No results found.  Procedures   Medications Ordered in the ED  amoxicillin-clavulanate (AUGMENTIN) 875-125 MG per tablet 1 tablet (has no administration in time range)  oxyCODONE -acetaminophen  (PERCOCET/ROXICET) 5-325  MG per tablet 1 tablet (has no administration in time range)   Medical Decision Making Risk Prescription drug management.   34 year old female presents for evaluation of dental pain.  On exam, HD stable.  Lung sounds are clear bilaterally, no hypoxia.  Abdomen soft and compressible.  Neuroexam at baseline.  Left lower teeth without abscess.  Uvula midline, handling secretions appropriately.  There are dental caries.  Patient given Augmentin, pain control here.  Will discharge patient home with Augmentin, naproxen.  Advised to follow-up with dental resources in the area, given dental resource guide.  Return precautions were provided and she voiced understanding.  Stable to discharge.    Final diagnoses:   Pain, dental    ED Discharge Orders          Ordered    naproxen (NAPROSYN) 500 MG tablet  2 times daily        10/31/24 0014    amoxicillin-clavulanate (AUGMENTIN) 875-125 MG tablet  Every 12 hours        10/31/24 0014               Ruthell Lonni FALCON, PA-C 10/31/24 0015    Mesner, Selinda, MD 10/31/24 [phone]  "

## 2024-10-31 NOTE — Discharge Instructions (Addendum)
 Please follow-up with dentist in the area.  You may take naproxen twice a day for pain.  Please be aware naproxen is an NSAID so do not take other NSAID such as ibuprofen  with this.  You may take Tylenol  every 6 hours.  Take Augmentin twice a day, this is an antibiotic to treat infection.  Follow-up with dentistry in the area.  Return to the ED with new symptoms.
